# Patient Record
Sex: Male | Born: 1955 | Race: Black or African American | Hispanic: No | Smoking: Former smoker
Health system: Southern US, Community
[De-identification: ages and names within clinical notes are randomized; demographics above are authoritative.]

## PROBLEM LIST (undated history)

## (undated) DIAGNOSIS — N419 Inflammatory disease of prostate, unspecified: Secondary | ICD-10-CM

## (undated) DIAGNOSIS — I10 Essential (primary) hypertension: Secondary | ICD-10-CM

## (undated) HISTORY — PX: COLONOSCOPY: SHX174

## (undated) HISTORY — DX: Inflammatory disease of prostate, unspecified: N41.9

## (undated) HISTORY — PX: WISDOM TOOTH EXTRACTION: SHX21

## (undated) HISTORY — DX: Essential (primary) hypertension: I10

## (undated) HISTORY — PX: HERNIA REPAIR: SHX51

---

## 1998-01-14 ENCOUNTER — Encounter: Admission: RE | Admit: 1998-01-14 | Discharge: 1998-01-14 | Payer: Self-pay | Admitting: *Deleted

## 1998-02-12 ENCOUNTER — Encounter: Admission: RE | Admit: 1998-02-12 | Discharge: 1998-02-12 | Payer: Self-pay | Admitting: *Deleted

## 1999-05-09 HISTORY — PX: INGUINAL HERNIA REPAIR: SHX194

## 1999-12-19 ENCOUNTER — Ambulatory Visit (HOSPITAL_BASED_OUTPATIENT_CLINIC_OR_DEPARTMENT_OTHER): Admission: RE | Admit: 1999-12-19 | Discharge: 1999-12-19 | Payer: Self-pay

## 2004-11-02 ENCOUNTER — Ambulatory Visit: Payer: Self-pay | Admitting: Internal Medicine

## 2005-01-31 ENCOUNTER — Ambulatory Visit: Payer: Self-pay | Admitting: Internal Medicine

## 2005-07-25 ENCOUNTER — Ambulatory Visit: Payer: Self-pay | Admitting: Internal Medicine

## 2005-09-18 ENCOUNTER — Ambulatory Visit: Payer: Self-pay | Admitting: Internal Medicine

## 2005-09-27 ENCOUNTER — Ambulatory Visit: Payer: Self-pay | Admitting: Gastroenterology

## 2005-10-13 ENCOUNTER — Encounter: Payer: Self-pay | Admitting: Gastroenterology

## 2005-10-13 ENCOUNTER — Encounter (INDEPENDENT_AMBULATORY_CARE_PROVIDER_SITE_OTHER): Payer: Self-pay | Admitting: Specialist

## 2005-10-13 ENCOUNTER — Ambulatory Visit: Payer: Self-pay | Admitting: Gastroenterology

## 2006-01-15 ENCOUNTER — Ambulatory Visit: Payer: Self-pay | Admitting: Internal Medicine

## 2006-07-11 ENCOUNTER — Ambulatory Visit: Payer: Self-pay | Admitting: Internal Medicine

## 2007-01-21 ENCOUNTER — Ambulatory Visit: Payer: Self-pay | Admitting: Internal Medicine

## 2007-01-21 DIAGNOSIS — I10 Essential (primary) hypertension: Secondary | ICD-10-CM | POA: Insufficient documentation

## 2007-01-21 HISTORY — DX: Essential (primary) hypertension: I10

## 2007-02-12 ENCOUNTER — Emergency Department (HOSPITAL_COMMUNITY): Admission: EM | Admit: 2007-02-12 | Discharge: 2007-02-12 | Payer: Self-pay | Admitting: Emergency Medicine

## 2007-02-22 ENCOUNTER — Encounter: Payer: Self-pay | Admitting: Internal Medicine

## 2008-06-01 ENCOUNTER — Ambulatory Visit: Payer: Self-pay | Admitting: Internal Medicine

## 2008-06-03 ENCOUNTER — Telehealth: Payer: Self-pay | Admitting: Internal Medicine

## 2008-06-03 LAB — CONVERTED CEMR LAB
ALT: 25 units/L (ref 0–53)
AST: 22 units/L (ref 0–37)
Alkaline Phosphatase: 37 units/L — ABNORMAL LOW (ref 39–117)
Calcium: 9.4 mg/dL (ref 8.4–10.5)
Cholesterol: 168 mg/dL (ref 0–200)
Eosinophils Relative: 5.4 % — ABNORMAL HIGH (ref 0.0–5.0)
GFR calc Af Amer: 91 mL/min
GFR calc non Af Amer: 75 mL/min
HDL: 48.6 mg/dL (ref >39.0)
LDL Cholesterol: 109 mg/dL — ABNORMAL HIGH (ref 0–99)
Lymphocytes Relative: 30.4 % (ref 12.0–46.0)
Monocytes Absolute: 0.9 10*3/uL (ref 0.1–1.0)
Neutro Abs: 2.9 10*3/uL (ref 1.4–7.7)
Platelets: 213 10*3/uL (ref 150–400)
RDW: 12.7 % (ref 11.5–14.6)
Sodium: 143 meq/L (ref 135–145)
TSH: 0.94 microintl units/mL (ref 0.35–5.50)
Total CHOL/HDL Ratio: 3.5
Triglycerides: 51 mg/dL (ref 0–149)
VLDL: 10 mg/dL (ref 0–40)
WBC: 5.9 10*3/uL (ref 4.5–10.5)

## 2010-01-31 ENCOUNTER — Ambulatory Visit: Payer: Self-pay | Admitting: Internal Medicine

## 2010-06-04 ENCOUNTER — Encounter: Payer: Self-pay | Admitting: Internal Medicine

## 2010-06-07 NOTE — Assessment & Plan Note (Signed)
Summary: med check and refills/cjr----PT [facility] // RS   Vital Signs:  Patient profile:   55 year old male Weight:      201 pounds Temp:     98.1 degrees F oral BP sitting:   130 / 84  (left arm) Cuff size:   regular  Vitals Entered By: Duard Brady LPN (January 31, 2010 8:33 AM) CC: medication review - doing well Is Patient Diabetic? No   CC:  medication review - doing well.  History of Present Illness: 55 year-old was seen today  for follow-up of his hypertensionthird he was seen for his annual physical about 8 months ago.  Laboratory studies were unremarkable.  He did have a colonoscopy in 2007.  He feels well today.  His weight is down approximately 7 pounds since his last visit here.  No concerns or complaints.  Allergies (verified): No Known Drug Allergies  Past History:  Past Medical History: Prostatitis Hypertension  Review of Systems  The patient denies anorexia, fever, weight loss, weight gain, vision loss, decreased hearing, hoarseness, chest pain, syncope, dyspnea on exertion, peripheral edema, prolonged cough, headaches, hemoptysis, abdominal pain, melena, hematochezia, severe indigestion/heartburn, hematuria, incontinence, genital sores, muscle weakness, suspicious skin lesions, transient blindness, difficulty walking, depression, unusual weight change, abnormal bleeding, enlarged lymph nodes, angioedema, breast masses, and testicular masses.    Physical Exam  General:  Well-developed,well-nourished,in no acute distress; alert,appropriate and cooperative throughout examination; 126/80 Head:  Normocephalic and atraumatic without obvious abnormalities. No apparent alopecia or balding. Eyes:  No corneal or conjunctival inflammation noted. EOMI. Perrla. Funduscopic exam benign, without hemorrhages, exudates or papilledema. Vision grossly normal. Mouth:  Oral mucosa and oropharynx without lesions or exudates.  Teeth in good repair. Neck:  No deformities, masses,  or tenderness noted. Lungs:  Normal respiratory effort, chest expands symmetrically. Lungs are clear to auscultation, no crackles or wheezes. Heart:  Normal rate and regular rhythm. S1 and S2 normal without gallop, murmur, click, rub or other extra sounds. Abdomen:  Bowel sounds positive,abdomen soft and non-tender without masses, organomegaly or hernias noted. Msk:  No deformity or scoliosis noted of thoracic or lumbar spine.   Pulses:  R and L carotid,radial,femoral,dorsalis pedis and posterior tibial pulses are full and equal bilaterally Extremities:  No clubbing, cyanosis, edema, or deformity noted with normal full range of motion of all joints.     Impression & Recommendations:  Problem # 1:  HYPERTENSION (ICD-401.9)  The following medications were removed from the medication list:    Benicar Hct 40-25 Mg Tabs (Olmesartan medoxomil-hctz) .Marland Kitchen... Take 1 tablet by mouth once a day His updated medication list for this problem includes:    Benazepril-hydrochlorothiazide 20-12.5 Mg Tabs (Benazepril-hydrochlorothiazide) .Marland Kitchen..Marland Kitchen Two daily  The following medications were removed from the medication list:    Benicar Hct 40-25 Mg Tabs (Olmesartan medoxomil-hctz) .Marland Kitchen... Take 1 tablet by mouth once a day His updated medication list for this problem includes:    Benazepril-hydrochlorothiazide 20-12.5 Mg Tabs (Benazepril-hydrochlorothiazide) .Marland Kitchen..Marland Kitchen Two daily  Complete Medication List: 1)  Benazepril-hydrochlorothiazide 20-12.5 Mg Tabs (Benazepril-hydrochlorothiazide) .... Two daily  Patient Instructions: 1)  Please schedule a follow-up appointment in 6 months for annual exam 2)  Limit your Sodium (Salt). 3)  It is important that you exercise regularly at least 20 minutes 5 times a week. If you develop chest pain, have severe difficulty breathing, or feel very tired , stop exercising immediately and seek medical attention. 4)  You need to lose weight. Consider a lower calorie diet and  regular exercise.   Prescriptions: BENAZEPRIL-HYDROCHLOROTHIAZIDE 20-12.5 MG TABS (BENAZEPRIL-HYDROCHLOROTHIAZIDE) two daily  #180 Tablet x 3   Entered and Authorized by:   Gordy Savers  MD   Signed by:   Gordy Savers  MD on 01/31/2010   Method used:   Electronically to        [facility] [phone]* (retail)       [address]       [place], [place]  [address]       Ph: [phone] or [phone]       Fax: [phone]   RxID:   [id number]

## 2010-06-10 ENCOUNTER — Ambulatory Visit (INDEPENDENT_AMBULATORY_CARE_PROVIDER_SITE_OTHER): Payer: Medicare HMO | Admitting: Internal Medicine

## 2010-06-10 ENCOUNTER — Ambulatory Visit: Admit: 2010-06-10 | Payer: Self-pay | Admitting: Internal Medicine

## 2010-06-10 ENCOUNTER — Encounter: Payer: Self-pay | Admitting: Internal Medicine

## 2010-06-10 DIAGNOSIS — Z0000 Encounter for general adult medical examination without abnormal findings: Secondary | ICD-10-CM

## 2010-06-10 DIAGNOSIS — I10 Essential (primary) hypertension: Secondary | ICD-10-CM

## 2010-06-10 DIAGNOSIS — Z125 Encounter for screening for malignant neoplasm of prostate: Secondary | ICD-10-CM

## 2010-06-10 DIAGNOSIS — Z23 Encounter for immunization: Secondary | ICD-10-CM

## 2010-06-10 LAB — LIPID PANEL
Cholesterol: 163 mg/dL (ref 0–200)
LDL Cholesterol: 107 mg/dL — ABNORMAL HIGH (ref 0–99)
Triglycerides: 42 mg/dL (ref 0.0–149.0)

## 2010-06-10 LAB — HEPATIC FUNCTION PANEL
ALT: 24 U/L (ref 0–53)
AST: 23 U/L (ref 0–37)
Total Protein: 6.3 g/dL (ref 6.0–8.3)

## 2010-06-10 LAB — BASIC METABOLIC PANEL
CO2: 29 mEq/L (ref 19–32)
Calcium: 9 mg/dL (ref 8.4–10.5)
GFR: 89.03 mL/min (ref >60.00)
Glucose, Bld: 87 mg/dL (ref 70–99)
Potassium: 3.8 mEq/L (ref 3.5–5.1)

## 2010-06-10 LAB — CBC WITH DIFFERENTIAL/PLATELET
Basophils Relative: 0.6 % (ref 0.0–3.0)
Eosinophils Relative: 8 % — ABNORMAL HIGH (ref 0.0–5.0)
Hemoglobin: 14.3 g/dL (ref 13.0–17.0)
Lymphocytes Relative: 38.6 % (ref 12.0–46.0)
MCHC: 35 g/dL (ref 30.0–36.0)
Neutro Abs: 2 10*3/uL (ref 1.4–7.7)
Platelets: 217 10*3/uL (ref 150.0–400.0)
RDW: 13.5 % (ref 11.5–14.6)
WBC: 5.3 10*3/uL (ref 4.5–10.5)

## 2010-06-10 LAB — PSA: PSA: 2.04 ng/mL (ref 0.10–4.00)

## 2010-06-10 MED ORDER — BENAZEPRIL-HYDROCHLOROTHIAZIDE 20-12.5 MG PO TABS: 1.0000 | ORAL_TABLET | Freq: Every day | ORAL | Status: DC

## 2010-06-10 NOTE — Assessment & Plan Note (Signed)
Will continue his present blood pressure medication, exercise, salt reduction encouraged.  Modest weight loss encouraged.  Have asked the patient to monitor his blood pressure as an outpatient.

## 2010-06-10 NOTE — Progress Notes (Signed)
Addended by: Duard Brady on: 06/10/2010 10:50 AM   Modules accepted: Orders

## 2010-06-10 NOTE — Progress Notes (Signed)
  Subjective:    Patient ID: Jonathan Marks, male    DOB: July 22, 1955, 55 y.o.   MRN: [id number]  HPI  55 year old patient who is seen today for a health maintenance examination.  He does quite well.  Remains on antihypertensive medication with lysed blood pressure control.  No concerns or complaints today.  He is a former smoker, but discontinued years ago.  He did have screening colonoscopy at age 55 due to bright red rectal bleeding.    Review of Systems  Constitutional: Negative for fever, chills, activity change, appetite change and fatigue.  HENT: Negative for hearing loss, ear pain, congestion, rhinorrhea, sneezing, mouth sores, trouble swallowing, neck pain, neck stiffness, dental problem, voice change, sinus pressure and tinnitus.   Eyes: Negative for photophobia, pain, redness and visual disturbance.  Respiratory: Negative for apnea, cough, choking, chest tightness, shortness of breath and wheezing.   Cardiovascular: Negative for chest pain, palpitations and leg swelling.  Gastrointestinal: Negative for nausea, vomiting, abdominal pain, diarrhea, constipation, blood in stool, abdominal distention, anal bleeding and rectal pain.  Genitourinary: Negative for dysuria, urgency, frequency, hematuria, flank pain, decreased urine volume, discharge, penile swelling, scrotal swelling, difficulty urinating, genital sores and testicular pain.  Musculoskeletal: Negative for myalgias, back pain, joint swelling, arthralgias and gait problem.  Skin: Negative for color change, rash and wound.  Neurological: Negative for dizziness, tremors, seizures, syncope, facial asymmetry, speech difficulty, weakness, light-headedness, numbness and headaches.  Hematological: Negative for adenopathy. Does not bruise/bleed easily.  Psychiatric/Behavioral: Negative for suicidal ideas, hallucinations, behavioral problems, confusion, sleep disturbance, self-injury, dysphoric mood, decreased concentration and agitation. The  patient is not nervous/anxious.        Objective:   Physical Exam  Constitutional: He appears well-developed and well-nourished.  HENT:  Head: Normocephalic and atraumatic.  Right Ear: External ear normal.  Left Ear: External ear normal.  Nose: Nose normal.  Mouth/Throat: Oropharynx is clear and moist.  Eyes: Conjunctivae and EOM are normal. Pupils are equal, round, and reactive to light. No scleral icterus.  Neck: Normal range of motion. Neck supple. No JVD present. No thyromegaly present.  Cardiovascular: Regular rhythm, normal heart sounds and intact distal pulses.  Exam reveals no gallop and no friction rub.   No murmur heard. Pulmonary/Chest: Effort normal and breath sounds normal. He exhibits no tenderness.  Abdominal: Soft. Bowel sounds are normal. He exhibits no distension and no mass. There is no tenderness.  Genitourinary: Prostate normal and penis normal.  Musculoskeletal: Normal range of motion. He exhibits no edema and no tenderness.  Lymphadenopathy:    He has no cervical adenopathy.  Neurological: He is alert. He has normal reflexes. No cranial nerve deficit. Coordination normal.  Skin: Skin is warm and dry. No rash noted.  Psychiatric: He has a normal mood and affect. His behavior is normal.          Assessment & Plan:   1.  Unremarkable health maintenance examination

## 2010-06-10 NOTE — Patient Instructions (Signed)

## 2010-09-23 NOTE — Op Note (Signed)
[place]  Patient:    Jonathan Marks, Jonathan Marks                       MRN: [id number] Proc. Date: 12/19/99 Adm. Date:  [id number] Attending:  Henrene Dodge CC:         Anselm Pancoast. Zachery Dakins, M.D.                           Operative Report  PREOPERATIVE DIAGNOSIS:  Left inguinal hernia.  POSTOPERATIVE DIAGNOSIS:  Left direct inguinal hernia.  PROCEDURE:  Left inguinal herniorrhaphy with mesh reinforcement.  ANESTHESIA:  Local with sedation.  SURGEON:  Anselm Pancoast. Zachery Dakins, M.D.  ASSISTANT:  Nurse.  HISTORY:  Jonathan Marks is a 55 year old sort of thin, muscular male, who was referred to [place] for symptomatic left inguinal hernia.  The patient works out at [employer], etc., with a moderate amount of lifting.  He states that approximately four, maybe six weeks ago, started having some difficulty with pain and, afterward, noticed a definite bulge.  With straining he has an obvious hernia.  It relaxes spontaneously, sort of like the size of a large thumb, and I thought it goes to the external ring, and thought this was probably an indirect inguinal hernia.  I could not feel a definite hernia on the right.  There is a little bit of a weakness, and I recommended that we repair this with local with sedation, and thought most likely it would be an indirect inguinal hernia.  The patient has talked with his employers, and probably will need to be off work for four weeks since he is not able to go back on a part-time or light activity.  Preoperatively he was given a g of Kefzol and taken to the operative suite.  PROCEDURE IN DETAIL:  The abdomen was first shaved and then prepped with Betadine solution and draped in a sterile manner.  The inguinal incision area was infiltrated with a mixture of Marcaine and 0.5% Xylocaine with adrenalin. Then the ilioinguinal nerve area was infiltrated with a blunted 22-gauge needle,  all total about 35-40 cc of solution was used.  Sharp dissection of the skin, subcutaneous, Scarpas fascia were identified in the external oblique.  I opened the external oblique through the external ring, identified the ilioinguinal nerve, and the hernia that you can see easily protruding appears to be direct, not an indirect hernia.  The cord structures were separated from the direct hernia.  The nerve was kept with the cord structures.  Then I freed the direct weakness circumferentially, then basically opened it, and did a Shouldice type repair with superior and inferior flaps, the inferior being sutured to the lateral edge of the rectus and then superior to the inguinal ligament. This was done with a running 2-0 Prolene.  Then I placed a piece of Prolene mesh shaped like a sail slipped laterally on the floor and sutured with a running 2-0 Prolene, suited it to the inguinal ligament, with two tails being sutured together laterally recreating an internal ring, and then the superior flap being sutured with interrupted sutures of 2-0 Prolene to the conjoined tendon and rectus internal oblique area.  The ilioinguinal nerve was protected and brought out with the cord structures under the mesh.  Then the external oblique was closed with a running 3-0 Vicryl, and the Scarpas fascia was closed with interrupted  3-0 Vicryl fibredex run subcuticular, and then Steri-Strips on the skin.  The patient tolerated the procedure nicely and will be released after a short stay in the recovery room.  I will see him back in the office in approximately ten days. DD:  12/19/99 TD:  12/19/99 Job: [id number]

## 2010-12-27 ENCOUNTER — Other Ambulatory Visit: Payer: Self-pay

## 2010-12-27 DIAGNOSIS — I10 Essential (primary) hypertension: Secondary | ICD-10-CM

## 2010-12-27 MED ORDER — BENAZEPRIL-HYDROCHLOROTHIAZIDE 20-12.5 MG PO TABS: 1.0000 | ORAL_TABLET | Freq: Every day | ORAL | Status: DC

## 2011-02-06 HISTORY — PX: OTHER SURGICAL HISTORY: SHX169

## 2011-02-16 LAB — URINALYSIS, ROUTINE W REFLEX MICROSCOPIC
Glucose, UA: NEGATIVE
Leukocytes, UA: NEGATIVE
Urobilinogen, UA: 1
pH: 5.5

## 2011-02-16 LAB — URINE MICROSCOPIC-ADD ON

## 2011-02-16 LAB — POCT URINE HEMOGLOBIN: Operator id: 24446

## 2011-02-22 ENCOUNTER — Encounter (HOSPITAL_BASED_OUTPATIENT_CLINIC_OR_DEPARTMENT_OTHER)
Admission: RE | Admit: 2011-02-22 | Discharge: 2011-02-22 | Disposition: A | Discharge status: Home / Self Care | Payer: Managed Care, Other (non HMO) | Source: Ambulatory Visit | Attending: Podiatry | Admitting: Podiatry

## 2011-02-22 LAB — BASIC METABOLIC PANEL
BUN: 15 mg/dL (ref 6–23)
CO2: 28 mEq/L (ref 19–32)
Calcium: 9.6 mg/dL (ref 8.4–10.5)
Chloride: 103 mEq/L (ref 96–112)
Creatinine, Ser: 1.2 mg/dL (ref 0.50–1.35)
GFR calc Af Amer: 78 mL/min — ABNORMAL LOW (ref >90)
Glucose, Bld: 96 mg/dL (ref 70–99)
Potassium: 3.8 mEq/L (ref 3.5–5.1)

## 2011-02-22 LAB — DIFFERENTIAL
Eosinophils Absolute: 0.2 10*3/uL (ref 0.0–0.7)
Eosinophils Relative: 3 % (ref 0–5)
Monocytes Absolute: 0.8 10*3/uL (ref 0.1–1.0)
Monocytes Relative: 13 % — ABNORMAL HIGH (ref 3–12)
Neutro Abs: 2.8 10*3/uL (ref 1.7–7.7)
Neutrophils Relative %: 47 % (ref 43–77)

## 2011-02-22 LAB — CBC
Hemoglobin: 13.7 g/dL (ref 13.0–17.0)
MCH: 30.7 pg (ref 26.0–34.0)

## 2011-02-24 ENCOUNTER — Ambulatory Visit (HOSPITAL_BASED_OUTPATIENT_CLINIC_OR_DEPARTMENT_OTHER)
Admission: RE | Admit: 2011-02-24 | Discharge: 2011-02-24 | Disposition: A | Discharge status: Home / Self Care | Payer: Managed Care, Other (non HMO) | Source: Ambulatory Visit | Attending: Podiatry | Admitting: Podiatry

## 2011-02-24 DIAGNOSIS — Z01810 Encounter for preprocedural cardiovascular examination: Secondary | ICD-10-CM | POA: Insufficient documentation

## 2011-02-24 DIAGNOSIS — M722 Plantar fascial fibromatosis: Secondary | ICD-10-CM | POA: Insufficient documentation

## 2011-02-24 DIAGNOSIS — Z01812 Encounter for preprocedural laboratory examination: Secondary | ICD-10-CM | POA: Insufficient documentation

## 2011-02-24 DIAGNOSIS — I10 Essential (primary) hypertension: Secondary | ICD-10-CM | POA: Insufficient documentation

## 2011-02-24 DIAGNOSIS — Q6600 Congenital talipes equinovarus, unspecified foot: Secondary | ICD-10-CM | POA: Insufficient documentation

## 2011-03-02 NOTE — Op Note (Signed)
NAMEGORMAN, SAFI                ACCOUNT NO.:  [id number]  MEDICAL RECORD NO.:  [id number]  LOCATION:                                 FACILITY:  PHYSICIAN:  Merwyn Katos, DPM       DATE OF BIRTH:  06/22/1955  DATE OF PROCEDURE: DATE OF DISCHARGE:                              OPERATIVE REPORT   SURGEON:  Merwyn Katos, DPM  PREOPERATIVE DIAGNOSES: 1. 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 2. Plantar fasciitis, left.  POSTOPERATIVE DIAGNOSES: 1. 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 2. Plantar fasciitis, left.  PROCEDURES: 1. Endoscopic gastrocnemius resection, left. 2. Plantar fasciotomy, left with TOPAZ MicroDebrider.  ANESTHESIA:  General.  HEMOSTASIS:  Calf tourniquet at 250 mmHg, left.  ESTIMATED BLOOD LOSS:  Minimal.  PREOPERATIVE MEDICATIONS:  Ancef 2 g IV preop.  INDICATIONS:  The patient presented with a complaint of pain at the heel.  He has had continued pain despite injections and conservative treatment with immobilization, braces, and splints.  On physical examination, the patient had tenderness at the plantar fascia and the plantar calcaneus and tubercle.  There was restriction of motion at the ankle joint with gastroc equinus present.  Discussed with the patient surgery consistent with gastroc resection through an endoscope, as well as plantar fasciotomy with TOPAZ MicroDebrider , left.  Discussed the procedure, risks, and benefits of the discussed procedure.  Consent was signed and all questions answered and the patient was cleared for surgery, history and physical.  PROCEDURE IN DETAIL:  The patient was placed in a supine position on the operating table.  General anesthesia administered.  Block at surgical site performed including a PT block with 10 mL of 1:1 mixture of 0.5% Marcaine plain and 2% lidocaine plain.  Block was performed at the gastroc resection site with 10 mL of  2% lidocaine with epinephrine. Foot was prepped and draped in a standard manner.  Foot exsanguinated with an Esmarch bandage, and calf tourniquet inflated to 250 mmHg.  PROCEDURE 1:  Incision made along the medial aspect of the border of the gastroc aponeurosis.  Incision was carried down to the subcutaneous tissues down to the gastroc aponeurosis.  Probe placed across the aponeurosis to the lateral aspect.  Obturator and cannula placed through the incision to the lateral edge of the gastroc aponeurosis where an exit incision was made.  Cannula placed and obturator removed. Endoscope introduced with good visualization of the gastroc aponeurosis across the entire width.  The medial three-quarters of the gastroc aponeurosis was transected and lengthened with equal range of motion after doing this with the knee flexed or extended.  Surgery site was irrigated with normal saline.  Skin closed with 4-0 nylon simple interrupted sutures.  Surgery site infiltrated with 9 mL of 0.5% Marcaine plain and 1 mL of Decadron.  PROCEDURE 2:  28 stab incisions was made with 0.062 K-wire in the area area of tenderness which was marked out preoperatively.  Utilizing a TOPAZ MicroDebrider on a setting of 4 with saline running at approximately 2-3 drops per second, multiple variable depth debridements were  performed on the plantar fascia utilizing TOPAZ MicroDebrider.  Surgery site was irrigated with normal saline.  Xeroform gauze was applied to the incisions, and gauze, Kerlix, and Coban dressing was applied.  Also note that intraoperatively an additional 5 mL of 0.5% Marcaine plain was infiltrated in the surgery site at the plantar fascia.  During the procedure, there was no blood loss.  The patient was taken to recovery area in stable condition.     Merwyn Katos, DPM     JP/MEDQ  D:  02/24/2011  T:  02/24/2011  Job:  [id number]  Electronically Signed by Merwyn Katos DPM on 03/02/2011 01:36:46 PM

## 2011-04-24 ENCOUNTER — Other Ambulatory Visit: Payer: Self-pay | Admitting: Podiatry

## 2011-11-10 ENCOUNTER — Encounter: Payer: Self-pay | Admitting: Internal Medicine

## 2011-11-10 ENCOUNTER — Ambulatory Visit (INDEPENDENT_AMBULATORY_CARE_PROVIDER_SITE_OTHER): Payer: Managed Care, Other (non HMO) | Admitting: Internal Medicine

## 2011-11-10 VITALS — BP 122/80 | HR 80 | Temp 98.2°F | Resp 18 | Ht 70.0 in | Wt 206.0 lb

## 2011-11-10 DIAGNOSIS — Z0000 Encounter for general adult medical examination without abnormal findings: Secondary | ICD-10-CM

## 2011-11-10 DIAGNOSIS — I10 Essential (primary) hypertension: Secondary | ICD-10-CM

## 2011-11-10 LAB — COMPREHENSIVE METABOLIC PANEL
AST: 17 U/L (ref 0–37)
Alkaline Phosphatase: 46 U/L (ref 39–117)
BUN: 18 mg/dL (ref 6–23)
Chloride: 106 mEq/L (ref 96–112)
Creatinine, Ser: 1.1 mg/dL (ref 0.4–1.5)
Glucose, Bld: 97 mg/dL (ref 70–99)
Potassium: 3.7 mEq/L (ref 3.5–5.1)
Sodium: 140 mEq/L (ref 135–145)

## 2011-11-10 LAB — PSA: PSA: 2.98 ng/mL (ref 0.10–4.00)

## 2011-11-10 LAB — LIPID PANEL
Cholesterol: 162 mg/dL (ref 0–200)
HDL: 54.5 mg/dL (ref >39.00)
LDL Cholesterol: 95 mg/dL (ref 0–99)
Triglycerides: 64 mg/dL (ref 0.0–149.0)
VLDL: 12.8 mg/dL (ref 0.0–40.0)

## 2011-11-10 LAB — CBC WITH DIFFERENTIAL/PLATELET
Basophils Absolute: 0 10*3/uL (ref 0.0–0.1)
Eosinophils Absolute: 0.2 10*3/uL (ref 0.0–0.7)
Eosinophils Relative: 4 % (ref 0.0–5.0)
Hemoglobin: 14.5 g/dL (ref 13.0–17.0)
Lymphocytes Relative: 31.3 % (ref 12.0–46.0)
MCHC: 33.4 g/dL (ref 30.0–36.0)
Monocytes Relative: 14.3 % — ABNORMAL HIGH (ref 3.0–12.0)
Neutro Abs: 2.8 10*3/uL (ref 1.4–7.7)
Neutrophils Relative %: 49.8 % (ref 43.0–77.0)

## 2011-11-10 LAB — TSH: TSH: 1.29 u[IU]/mL (ref 0.35–5.50)

## 2011-11-10 MED ORDER — BENAZEPRIL-HYDROCHLOROTHIAZIDE 20-12.5 MG PO TABS: 1.0000 | ORAL_TABLET | Freq: Every day | ORAL | Status: DC

## 2011-11-10 NOTE — Patient Instructions (Signed)
Limit your sodium (Salt) intake  Please check your blood pressure on a regular basis.  If it is consistently greater than 150/90, please make an office appointment.  Return in one year for follow-up  

## 2011-11-10 NOTE — Progress Notes (Signed)
Subjective:    Patient ID: Jonathan Marks, male    DOB: Dec 05, 1955, 56 y.o.   MRN: [id number]  HPI  56 year old patient who is seen today for a wellness exam. He has treated hypertension and enjoys excellent health. No concerns or complaints. He did have a screening colonoscopy between the ages of [age]. No concerns or complaints today  Past Medical History  Diagnosis Date  . Unspecified essential hypertension 01/21/2007  . Prostatitis     History   Social History  . Marital Status: Married    Spouse Name: N/A    Number of Children: N/A  . Years of Education: N/A   Occupational History  . Not on file.   Social History Main Topics  . Smoking status: Former Games developer  . Smokeless tobacco: Not on file  . Alcohol Use: No  . Drug Use:   . Sexually Active:      1 daughter, 2 grandchildren   Other Topics Concern  . Not on file   Social History Narrative  . No narrative on file    Past Surgical History  Procedure Date  . Inguinal hernia repair 2001    left , Dr.Weatherly  . Colonoscopy     age 23  .  left plantar fasciotomy October 2012    Family History  Problem Relation Age of Onset  . Hypertension Mother   . Alcohol abuse Father   . Stroke Father   . Alcohol abuse Other   . Hypertension Other   . Stroke Father   . Pancreatic cancer Sister     No Known Allergies  No current outpatient prescriptions on file prior to visit.    BP 122/80  Pulse 80  Temp 98.2 F (36.8 C) (Oral)  Resp 18  Ht 5\' 10"  (1.778 m)  Wt 206 lb (93.441 kg)  BMI 29.56 kg/m2  SpO2 97%      Review of Systems  Constitutional: Negative for fever, chills, activity change, appetite change and fatigue.  HENT: Negative for hearing loss, ear pain, congestion, rhinorrhea, sneezing, mouth sores, trouble swallowing, neck pain, neck stiffness, dental problem, voice change, sinus pressure and tinnitus.   Eyes: Negative for photophobia, pain, redness and visual disturbance.  Respiratory:  Negative for apnea, cough, choking, chest tightness, shortness of breath and wheezing.   Cardiovascular: Negative for chest pain, palpitations and leg swelling.  Gastrointestinal: Negative for nausea, vomiting, abdominal pain, diarrhea, constipation, blood in stool, abdominal distention, anal bleeding and rectal pain.  Genitourinary: Negative for dysuria, urgency, frequency, hematuria, flank pain, decreased urine volume, discharge, penile swelling, scrotal swelling, difficulty urinating, genital sores and testicular pain.  Musculoskeletal: Negative for myalgias, back pain, joint swelling, arthralgias and gait problem.  Skin: Negative for color change, rash and wound.  Neurological: Negative for dizziness, tremors, seizures, syncope, facial asymmetry, speech difficulty, weakness, light-headedness, numbness and headaches.  Hematological: Negative for adenopathy. Does not bruise/bleed easily.  Psychiatric/Behavioral: Negative for suicidal ideas, hallucinations, behavioral problems, confusion, disturbed wake/sleep cycle, self-injury, dysphoric mood, decreased concentration and agitation. The patient is not nervous/anxious.        Objective:   Physical Exam  Constitutional: He appears well-developed and well-nourished.  HENT:  Head: Normocephalic and atraumatic.  Right Ear: External ear normal.  Left Ear: External ear normal.  Nose: Nose normal.  Mouth/Throat: Oropharynx is clear and moist.  Eyes: Conjunctivae and EOM are normal. Pupils are equal, round, and reactive to light. No scleral icterus.  Neck: Normal range of motion. Neck supple.  No JVD present. No thyromegaly present.  Cardiovascular: Regular rhythm, normal heart sounds and intact distal pulses.  Exam reveals no gallop and no friction rub.   No murmur heard. Pulmonary/Chest: Effort normal and breath sounds normal. He exhibits no tenderness.  Abdominal: Soft. Bowel sounds are normal. He exhibits no distension and no mass. There is no  tenderness.  Genitourinary: Prostate normal and penis normal.  Musculoskeletal: Normal range of motion. He exhibits no edema and no tenderness.       A bony nodule just inferior to the left patella  Lymphadenopathy:    He has no cervical adenopathy.  Neurological: He is alert. He has normal reflexes. No cranial nerve deficit. Coordination normal.  Skin: Skin is warm and dry. No rash noted.  Psychiatric: He has a normal mood and affect. His behavior is normal.          Assessment & Plan:   Preventive health examination Hypertension  Low-salt diet recommended Regular exercise encouraged Home blood pressure monitoring encouraged  Recheck one year

## 2011-11-10 NOTE — Progress Notes (Signed)
Quick Note:  Attempt to call - VM- LMTCB if questions - labs normal ______ 

## 2012-01-21 ENCOUNTER — Other Ambulatory Visit: Payer: Self-pay | Admitting: Internal Medicine

## 2012-02-20 ENCOUNTER — Emergency Department (HOSPITAL_COMMUNITY)
Admission: EM | Admit: 2012-02-20 | Discharge: 2012-02-20 | Disposition: A | Discharge status: Home / Self Care | Payer: Managed Care, Other (non HMO) | Attending: Emergency Medicine | Admitting: Emergency Medicine

## 2012-02-20 ENCOUNTER — Encounter (HOSPITAL_COMMUNITY): Payer: Self-pay | Admitting: Emergency Medicine

## 2012-02-20 DIAGNOSIS — Z87891 Personal history of nicotine dependence: Secondary | ICD-10-CM | POA: Insufficient documentation

## 2012-02-20 DIAGNOSIS — S90569A Insect bite (nonvenomous), unspecified ankle, initial encounter: Secondary | ICD-10-CM | POA: Insufficient documentation

## 2012-02-20 DIAGNOSIS — Y998 Other external cause status: Secondary | ICD-10-CM | POA: Insufficient documentation

## 2012-02-20 DIAGNOSIS — W57XXXA Bitten or stung by nonvenomous insect and other nonvenomous arthropods, initial encounter: Secondary | ICD-10-CM | POA: Insufficient documentation

## 2012-02-20 DIAGNOSIS — Y93I9 Activity, other involving external motion: Secondary | ICD-10-CM | POA: Insufficient documentation

## 2012-02-20 DIAGNOSIS — T07XXXA Unspecified multiple injuries, initial encounter: Secondary | ICD-10-CM

## 2012-02-20 MED ORDER — ACETAMINOPHEN 325 MG PO TABS
650.0000 mg | ORAL_TABLET | Freq: Once | ORAL | Status: AC
  Administered 2012-02-20: 650 mg via ORAL
  Filled 2012-02-20 (×2): qty 2
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
## 2012-02-20 NOTE — ED Notes (Signed)
EDP took pt off LSB and out of C collar.

## 2012-02-27 ENCOUNTER — Ambulatory Visit (INDEPENDENT_AMBULATORY_CARE_PROVIDER_SITE_OTHER): Payer: Managed Care, Other (non HMO) | Admitting: Internal Medicine

## 2012-02-27 ENCOUNTER — Encounter: Payer: Self-pay | Admitting: Internal Medicine

## 2012-02-27 VITALS — BP 128/90 | Temp 97.9°F | Wt 207.0 lb

## 2012-02-27 DIAGNOSIS — R51 Headache: Secondary | ICD-10-CM

## 2012-02-27 DIAGNOSIS — I10 Essential (primary) hypertension: Secondary | ICD-10-CM

## 2012-02-27 DIAGNOSIS — M542 Cervicalgia: Secondary | ICD-10-CM
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
## 2012-02-27 NOTE — Patient Instructions (Signed)
Relafen one daily  You  may move around, but avoid painful motions and activities.  Apply  Heat  to the sore area for 15 to 20 minutes 3 or 4 times daily for the next two to 3 days.  Call or return to clinic prn if these symptoms worsen or fail to improve as anticipated.

## 2012-06-05 ENCOUNTER — Other Ambulatory Visit: Payer: Self-pay | Admitting: *Deleted

## 2012-06-05 DIAGNOSIS — I10 Essential (primary) hypertension: Secondary | ICD-10-CM

## 2012-06-05 MED ORDER — BENAZEPRIL-HYDROCHLOROTHIAZIDE 20-12.5 MG PO TABS: 1.0000 | ORAL_TABLET | Freq: Every day | ORAL | Status: DC

## 2012-11-17 ENCOUNTER — Ambulatory Visit: Payer: Managed Care, Other (non HMO) | Admitting: Emergency Medicine

## 2012-11-17 VITALS — BP 153/85 | HR 62 | Temp 97.8°F | Resp 18 | Wt 202.0 lb

## 2012-11-17 DIAGNOSIS — L02219 Cutaneous abscess of trunk, unspecified: Secondary | ICD-10-CM

## 2012-11-17 DIAGNOSIS — L03319 Cellulitis of trunk, unspecified: Secondary | ICD-10-CM

## 2012-11-17 MED ORDER — SULFAMETHOXAZOLE-TRIMETHOPRIM 800-160 MG PO TABS: 1.0000 | ORAL_TABLET | Freq: Two times a day (BID) | ORAL | Status: DC

## 2012-11-17 NOTE — Patient Instructions (Addendum)
Abscess An abscess is an infected area that contains a collection of pus and debris.It can occur in almost any part of the body. An abscess is also known as a furuncle or boil. CAUSES  An abscess occurs when tissue gets infected. This can occur from blockage of oil or sweat glands, infection of hair follicles, or a minor injury to the skin. As the body tries to fight the infection, pus collects in the area and creates pressure under the skin. This pressure causes pain. People with weakened immune systems have difficulty fighting infections and get certain abscesses more often.  SYMPTOMS Usually an abscess develops on the skin and becomes a painful mass that is red, warm, and tender. If the abscess forms under the skin, you may feel a moveable soft area under the skin. Some abscesses break open (rupture) on their own, but most will continue to get worse without care. The infection can spread deeper into the body and eventually into the bloodstream, causing you to feel ill.  DIAGNOSIS  Your caregiver will take your medical history and perform a physical exam. A sample of fluid may also be taken from the abscess to determine what is causing your infection. TREATMENT  Your caregiver may prescribe antibiotic medicines to fight the infection. However, taking antibiotics alone usually does not cure an abscess. Your caregiver may need to make a small cut (incision) in the abscess to drain the pus. In some cases, gauze is packed into the abscess to reduce pain and to continue draining the area. HOME CARE INSTRUCTIONS   Only take over-the-counter or prescription medicines for pain, discomfort, or fever as directed by your caregiver.  If you were prescribed antibiotics, take them as directed. Finish them even if you start to feel better.  If gauze is used, follow your caregiver's directions for changing the gauze.  To avoid spreading the infection:  Keep your draining abscess covered with a  bandage.  Wash your hands well.  Do not share personal care items, towels, or whirlpools with others.  Avoid skin contact with others.  Keep your skin and clothes clean around the abscess.  Keep all follow-up appointments as directed by your caregiver. SEEK MEDICAL CARE IF:   You have increased pain, swelling, redness, fluid drainage, or bleeding.  You have muscle aches, chills, or a general ill feeling.  You have a fever. MAKE SURE YOU:   Understand these instructions.  Will watch your condition.  Will get help right away if you are not doing well or get worse. Document Released: 02/01/2005 Document Revised: 10/24/2011 Document Reviewed: 07/07/2011 [employer] Patient Information 2014 [employer].  

## 2012-11-17 NOTE — Progress Notes (Signed)
[facility] [address] [place] [address] [phone]  Date:  11/17/2012   Name:  Jonathan Marks   DOB:  1955-06-28   MRN:  [id number]  PCP:  Rogelia Boga, MD    Chief Complaint: bump on back   History of Present Illness:  Jonathan Marks is a 57 y.o. very pleasant male patient who presents with the following:  Has a tender mass on upper back for the past week.  Now very tender.  No history of injury or overuse.  No fever or chills.  No nausea or vomiting. No improvement with over the counter medications or other home remedies. Denies other complaint or health concern today.   Patient Active Problem List   Diagnosis Date Noted  . Hypertension 06/10/2010  . Health maintenance examination 06/10/2010  . Unspecified Essential Hypertension 01/21/2007    Past Medical History  Diagnosis Date  . Unspecified essential hypertension 01/21/2007  . Prostatitis     Past Surgical History  Procedure Laterality Date  . Inguinal hernia repair  2001    left , Dr.Weatherly  . Colonoscopy      age 35  .  left plantar fasciotomy  October 2012  . Hernia repair      History  Substance Use Topics  . Smoking status: Former Games developer  . Smokeless tobacco: Not on file  . Alcohol Use: No    Family History  Problem Relation Age of Onset  . Hypertension Mother   . Alcohol abuse Father   . Stroke Father   . Alcohol abuse Other   . Hypertension Other   . Stroke Father   . Pancreatic cancer Sister     No Known Allergies  Medication list has been reviewed and updated.  Current Outpatient Prescriptions on File Prior to Visit  Medication Sig Dispense Refill  . benazepril-hydrochlorthiazide (LOTENSIN HCT) 20-12.5 MG per tablet Take 1 tablet by mouth daily.  90 tablet  1  . cetirizine (ZYRTEC) 10 MG tablet Take 10 mg by mouth daily as needed. For allergies       No current facility-administered medications on file prior to visit.    Review of  Systems:  As per HPI, otherwise negative.    Physical Examination: Filed Vitals:   11/17/12 1306  BP: 153/85  Pulse: 62  Temp: 97.8 F (36.6 C)  Resp: 18   Filed Vitals:   11/17/12 1306  Weight: 202 lb (91.627 kg)   Body mass index is 28.98 kg/(m^2). Ideal Body Weight:     GEN: WDWN, NAD, Non-toxic, Alert & Oriented x 3 HEENT: Atraumatic, Normocephalic.  Ears and Nose: No external deformity. EXTR: No clubbing/cyanosis/edema NEURO: Normal gait.  PSYCH: Normally interactive. Conversant. Not depressed or anxious appearing.  Calm demeanor.  BACK  Sebaceous cyst that is infected in upper back.    Assessment and Plan: Infected sebaceous cyst Septra Local heat Discussed options including I&D, excision and antibiotics.  Elected last choice.  Will follow up when antibiotics are done or if it worsens.   Signed,  Phillips Odor, MD

## 2012-12-14 ENCOUNTER — Other Ambulatory Visit: Payer: Self-pay | Admitting: Internal Medicine

## 2013-03-07 ENCOUNTER — Encounter: Payer: Self-pay | Admitting: Internal Medicine

## 2013-03-07 ENCOUNTER — Ambulatory Visit (INDEPENDENT_AMBULATORY_CARE_PROVIDER_SITE_OTHER): Payer: Managed Care, Other (non HMO) | Admitting: Internal Medicine

## 2013-03-07 VITALS — BP 120/90 | HR 74 | Temp 98.3°F | Ht 70.75 in | Wt 203.0 lb

## 2013-03-07 DIAGNOSIS — I10 Essential (primary) hypertension: Secondary | ICD-10-CM

## 2013-03-07 DIAGNOSIS — Z0000 Encounter for general adult medical examination without abnormal findings: Secondary | ICD-10-CM

## 2013-03-07 LAB — COMPREHENSIVE METABOLIC PANEL
ALT: 22 U/L (ref 0–53)
BUN: 13 mg/dL (ref 6–23)
CO2: 29 mEq/L (ref 19–32)
Calcium: 9.2 mg/dL (ref 8.4–10.5)
Creatinine, Ser: 0.9 mg/dL (ref 0.4–1.5)
Sodium: 138 mEq/L (ref 135–145)
Total Bilirubin: 0.8 mg/dL (ref 0.3–1.2)
Total Protein: 6.3 g/dL (ref 6.0–8.3)

## 2013-03-07 LAB — CBC WITH DIFFERENTIAL/PLATELET
Basophils Absolute: 0 10*3/uL (ref 0.0–0.1)
Eosinophils Absolute: 0.4 10*3/uL (ref 0.0–0.7)
Eosinophils Relative: 8 % — ABNORMAL HIGH (ref 0.0–5.0)
Lymphs Abs: 1.7 10*3/uL (ref 0.7–4.0)
MCV: 89.4 fl (ref 78.0–100.0)
Monocytes Absolute: 0.6 10*3/uL (ref 0.1–1.0)
Monocytes Relative: 13.4 % — ABNORMAL HIGH (ref 3.0–12.0)
Neutro Abs: 2 10*3/uL (ref 1.4–7.7)
Platelets: 203 10*3/uL (ref 150.0–400.0)
RDW: 13.4 % (ref 11.5–14.6)

## 2013-03-07 LAB — TSH: TSH: 0.91 u[IU]/mL (ref 0.35–5.50)

## 2013-03-07 LAB — LIPID PANEL
Cholesterol: 159 mg/dL (ref 0–200)
Total CHOL/HDL Ratio: 3
Triglycerides: 31 mg/dL (ref 0.0–149.0)

## 2013-03-07 LAB — PSA: PSA: 2.74 ng/mL (ref 0.10–4.00)

## 2013-03-07 MED ORDER — BENAZEPRIL-HYDROCHLOROTHIAZIDE 20-12.5 MG PO TABS: 1.0000 | ORAL_TABLET | Freq: Every day | ORAL | Status: DC

## 2013-03-07 NOTE — Patient Instructions (Signed)
Limit your sodium (Salt) intake    It is important that you exercise regularly, at least 20 minutes 3 to 4 times per week.  If you develop chest pain or shortness of breath seek  medical attention.  Please check your blood pressure on a regular basis.  If it is consistently greater than 150/90, please make an office appointment.  

## 2013-03-07 NOTE — Progress Notes (Signed)
Patient ID: Jonathan Marks, male   DOB: [birth year], 56 y.o.   MRN: [id number]  Subjective:    Patient ID: Jonathan Marks, male    DOB: [birth year], 57 y.o.   MRN: [id number]  HPI  57 year old patient who is seen today for a wellness exam. He has treated hypertension and enjoys excellent health. No concerns or complaints. He did have a screening colonoscopy between the ages of [age]. No concerns or complaints today. He remained active at  work with the physical activity throughout the day   Past Medical History  Diagnosis Date  . Unspecified essential hypertension 01/21/2007  . Prostatitis     History   Social History  . Marital Status: Married    Spouse Name: N/A    Number of Children: N/A  . Years of Education: N/A   Occupational History  . Not on file.   Social History Main Topics  . Smoking status: Former Games developer  . Smokeless tobacco: Not on file  . Alcohol Use: No  . Drug Use:   . Sexual Activity:      Comment: 1 daughter, 2 grandchildren   Other Topics Concern  . Not on file   Social History Narrative  . No narrative on file    Past Surgical History  Procedure Laterality Date  . Inguinal hernia repair  2001    left , Dr.Weatherly  . Colonoscopy      age 51  .  left plantar fasciotomy  October 2012  . Hernia repair      Family History  Problem Relation Age of Onset  . Hypertension Mother   . Alcohol abuse Father   . Stroke Father   . Alcohol abuse Other   . Hypertension Other   . Stroke Father   . Pancreatic cancer Sister     No Known Allergies  Current Outpatient Prescriptions on File Prior to Visit  Medication Sig Dispense Refill  . benazepril-hydrochlorthiazide (LOTENSIN HCT) 20-12.5 MG per tablet Take 1 tablet by mouth daily.  90 tablet  1   No current facility-administered medications on file prior to visit.    BP 120/90  Pulse 74  Temp(Src) 98.3 F (36.8 C) (Oral)  Ht 5' 10.75" (1.797 m)  Wt 203 lb (92.08 kg)  BMI 28.51 kg/m2  SpO2  97%      Review of Systems  Constitutional: Negative for fever, chills, activity change, appetite change and fatigue.  HENT: Negative for congestion, dental problem, ear pain, hearing loss, mouth sores, rhinorrhea, sinus pressure, sneezing, tinnitus, trouble swallowing and voice change.   Eyes: Negative for photophobia, pain, redness and visual disturbance.  Respiratory: Negative for apnea, cough, choking, chest tightness, shortness of breath and wheezing.   Cardiovascular: Negative for chest pain, palpitations and leg swelling.  Gastrointestinal: Negative for nausea, vomiting, abdominal pain, diarrhea, constipation, blood in stool, abdominal distention, anal bleeding and rectal pain.  Genitourinary: Negative for dysuria, urgency, frequency, hematuria, flank pain, decreased urine volume, discharge, penile swelling, scrotal swelling, difficulty urinating, genital sores and testicular pain.  Musculoskeletal: Negative for arthralgias, back pain, gait problem, joint swelling, myalgias, neck pain and neck stiffness.  Skin: Negative for color change, rash and wound.  Neurological: Negative for dizziness, tremors, seizures, syncope, facial asymmetry, speech difficulty, weakness, light-headedness, numbness and headaches.  Hematological: Negative for adenopathy. Does not bruise/bleed easily.  Psychiatric/Behavioral: Negative for suicidal ideas, hallucinations, behavioral problems, confusion, sleep disturbance, self-injury, dysphoric mood, decreased concentration and agitation. The patient is not nervous/anxious.  Objective:   Physical Exam  Constitutional: He appears well-developed and well-nourished.  HENT:  Head: Normocephalic and atraumatic.  Right Ear: External ear normal.  Left Ear: External ear normal.  Nose: Nose normal.  Mouth/Throat: Oropharynx is clear and moist.  Eyes: Conjunctivae and EOM are normal. Pupils are equal, round, and reactive to light. No scleral icterus.  Neck:  Normal range of motion. Neck supple. No JVD present. No thyromegaly present.  Cardiovascular: Regular rhythm, normal heart sounds and intact distal pulses.  Exam reveals no gallop and no friction rub.   No murmur heard. Pulmonary/Chest: Effort normal and breath sounds normal. He exhibits no tenderness.  Abdominal: Soft. Bowel sounds are normal. He exhibits no distension and no mass. There is no tenderness.  Genitourinary: Prostate normal and penis normal.  Musculoskeletal: Normal range of motion. He exhibits no edema and no tenderness.  A bony nodule just inferior to the left patella  Lymphadenopathy:    He has no cervical adenopathy.  Neurological: He is alert. He has normal reflexes. No cranial nerve deficit. Coordination normal.  Skin: Skin is warm and dry. No rash noted.  Psychiatric: He has a normal mood and affect. His behavior is normal.          Assessment & Plan:   Preventive health examination Hypertension  Low-salt diet recommended Regular exercise encouraged Home blood pressure monitoring encouraged  Recheck one year

## 2014-03-29 ENCOUNTER — Other Ambulatory Visit: Payer: Self-pay | Admitting: Internal Medicine

## 2014-07-03 ENCOUNTER — Other Ambulatory Visit: Payer: Self-pay | Admitting: *Deleted

## 2014-07-03 ENCOUNTER — Ambulatory Visit (INDEPENDENT_AMBULATORY_CARE_PROVIDER_SITE_OTHER): Payer: Managed Care, Other (non HMO) | Admitting: Internal Medicine

## 2014-07-03 ENCOUNTER — Encounter: Payer: Self-pay | Admitting: Internal Medicine

## 2014-07-03 VITALS — BP 138/80 | HR 70 | Temp 98.0°F | Resp 20 | Ht 69.75 in | Wt 208.0 lb

## 2014-07-03 DIAGNOSIS — Z0000 Encounter for general adult medical examination without abnormal findings: Secondary | ICD-10-CM

## 2014-07-03 DIAGNOSIS — I10 Essential (primary) hypertension: Secondary | ICD-10-CM

## 2014-07-03 LAB — COMPREHENSIVE METABOLIC PANEL
ALBUMIN: 3.9 g/dL (ref 3.5–5.2)
ALK PHOS: 51 U/L (ref 39–117)
ALT: 18 U/L (ref 0–53)
AST: 17 U/L (ref 0–37)
BUN: 13 mg/dL (ref 6–23)
CO2: 31 meq/L (ref 19–32)
Calcium: 8.9 mg/dL (ref 8.4–10.5)
Chloride: 106 mEq/L (ref 96–112)
Creatinine, Ser: 1.08 mg/dL (ref 0.40–1.50)
GFR: 90.53 mL/min (ref >60.00)
Glucose, Bld: 99 mg/dL (ref 70–99)
POTASSIUM: 3.9 meq/L (ref 3.5–5.1)
Sodium: 140 mEq/L (ref 135–145)
TOTAL PROTEIN: 6.4 g/dL (ref 6.0–8.3)
Total Bilirubin: 0.7 mg/dL (ref 0.2–1.2)

## 2014-07-03 LAB — CBC WITH DIFFERENTIAL/PLATELET
BASOS ABS: 0 10*3/uL (ref 0.0–0.1)
Basophils Relative: 0.5 % (ref 0.0–3.0)
EOS ABS: 0.4 10*3/uL (ref 0.0–0.7)
Eosinophils Relative: 7.8 % — ABNORMAL HIGH (ref 0.0–5.0)
HCT: 43.7 % (ref 39.0–52.0)
HEMOGLOBIN: 15.2 g/dL (ref 13.0–17.0)
LYMPHS ABS: 1.5 10*3/uL (ref 0.7–4.0)
Lymphocytes Relative: 29.7 % (ref 12.0–46.0)
MCHC: 34.8 g/dL (ref 30.0–36.0)
MCV: 87.2 fl (ref 78.0–100.0)
Monocytes Absolute: 0.7 10*3/uL (ref 0.1–1.0)
Monocytes Relative: 14 % — ABNORMAL HIGH (ref 3.0–12.0)
Neutro Abs: 2.3 10*3/uL (ref 1.4–7.7)
Neutrophils Relative %: 48 % (ref 43.0–77.0)
Platelets: 211 10*3/uL (ref 150.0–400.0)
RBC: 5.01 Mil/uL (ref 4.22–5.81)
RDW: 13.2 % (ref 11.5–15.5)
WBC: 4.9 10*3/uL (ref 4.0–10.5)

## 2014-07-03 LAB — TSH: TSH: 0.7 u[IU]/mL (ref 0.35–4.50)

## 2014-07-03 LAB — LIPID PANEL
Cholesterol: 150 mg/dL (ref 0–200)
HDL: 46.8 mg/dL (ref >39.00)
LDL Cholesterol: 96 mg/dL (ref 0–99)
NonHDL: 103.2
TRIGLYCERIDES: 36 mg/dL (ref 0.0–149.0)
Total CHOL/HDL Ratio: 3
VLDL: 7.2 mg/dL (ref 0.0–40.0)

## 2014-07-03 MED ORDER — BENAZEPRIL-HYDROCHLOROTHIAZIDE 20-12.5 MG PO TABS: 1.0000 | ORAL_TABLET | Freq: Every day | ORAL | Status: DC

## 2014-07-03 NOTE — Patient Instructions (Signed)
Limit your sodium (Salt) intake    It is important that you exercise regularly, at least 20 minutes 3 to 4 times per week.  If you develop chest pain or shortness of breath seek  medical attention.  Please check your blood pressure on a regular basis.  If it is consistently greater than 150/90, please make an office appointment.  Health Maintenance A healthy lifestyle and preventative care can promote health and wellness.  Maintain regular health, dental, and eye exams.  Eat a healthy diet. Foods like vegetables, fruits, whole grains, low-fat dairy products, and lean protein foods contain the nutrients you need and are low in calories. Decrease your intake of foods high in solid fats, added sugars, and salt. Get information about a proper diet from your health care provider, if necessary.  Regular physical exercise is one of the most important things you can do for your health. Most adults should get at least 150 minutes of moderate-intensity exercise (any activity that increases your heart rate and causes you to sweat) each week. In addition, most adults need muscle-strengthening exercises on 2 or more days a week.   Maintain a healthy weight. The body mass index (BMI) is a screening tool to identify possible weight problems. It provides an estimate of body fat based on height and weight. Your health care provider can find your BMI and can help you achieve or maintain a healthy weight. For males 20 years and older:  A BMI below 18.5 is considered underweight.  A BMI of 18.5 to 24.9 is normal.  A BMI of 25 to 29.9 is considered overweight.  A BMI of 30 and above is considered obese.  Maintain normal blood lipids and cholesterol by exercising and minimizing your intake of saturated fat. Eat a balanced diet with plenty of fruits and vegetables. Blood tests for lipids and cholesterol should begin at age 20 and be repeated every 5 years. If your lipid or cholesterol levels are high, you are  over age 50, or you are at high risk for heart disease, you may need your cholesterol levels checked more frequently.Ongoing high lipid and cholesterol levels should be treated with medicines if diet and exercise are not working.  If you smoke, find out from your health care provider how to quit. If you do not use tobacco, do not start.  Lung cancer screening is recommended for adults aged 55-80 years who are at high risk for developing lung cancer because of a history of smoking. A yearly low-dose CT scan of the lungs is recommended for people who have at least a 30-pack-year history of smoking and are current smokers or have quit within the past 15 years. A pack year of smoking is smoking an average of 1 pack of cigarettes a day for 1 year (for example, a 30-pack-year history of smoking could mean smoking 1 pack a day for 30 years or 2 packs a day for 15 years). Yearly screening should continue until the smoker has stopped smoking for at least 15 years. Yearly screening should be stopped for people who develop a health problem that would prevent them from having lung cancer treatment.  If you choose to drink alcohol, do not have more than 2 drinks per day. One drink is considered to be 12 oz (360 mL) of beer, 5 oz (150 mL) of wine, or 1.5 oz (45 mL) of liquor.  Avoid the use of street drugs. Do not share needles with anyone. Ask for help if you   need support or instructions about stopping the use of drugs.  High blood pressure causes heart disease and increases the risk of stroke. Blood pressure should be checked at least every 1-2 years. Ongoing high blood pressure should be treated with medicines if weight loss and exercise are not effective.  If you are 45-79 years old, ask your health care provider if you should take aspirin to prevent heart disease.  Diabetes screening involves taking a blood sample to check your fasting blood sugar level. This should be done once every 3 years after age 45 if  you are at a normal weight and without risk factors for diabetes. Testing should be considered at a younger age or be carried out more frequently if you are overweight and have at least 1 risk factor for diabetes.  Colorectal cancer can be detected and often prevented. Most routine colorectal cancer screening begins at the age of 50 and continues through age 75. However, your health care provider may recommend screening at an earlier age if you have risk factors for colon cancer. On a yearly basis, your health care provider may provide home test kits to check for hidden blood in the stool. A small camera at the end of a tube may be used to directly examine the colon (sigmoidoscopy or colonoscopy) to detect the earliest forms of colorectal cancer. Talk to your health care provider about this at age 50 when routine screening begins. A direct exam of the colon should be repeated every 5-10 years through age 75, unless early forms of precancerous polyps or small growths are found.  People who are at an increased risk for hepatitis B should be screened for this virus. You are considered at high risk for hepatitis B if:  You were born in a country where hepatitis B occurs often. Talk with your health care provider about which countries are considered high risk.  Your parents were born in a high-risk country and you have not received a shot to protect against hepatitis B (hepatitis B vaccine).  You have HIV or AIDS.  You use needles to inject street drugs.  You live with, or have sex with, someone who has hepatitis B.  You are a man who has sex with other men (MSM).  You get hemodialysis treatment.  You take certain medicines for conditions like cancer, organ transplantation, and autoimmune conditions.  Hepatitis C blood testing is recommended for all people born from 1945 through 1965 and any individual with known risk factors for hepatitis C.  Healthy men should no longer receive prostate-specific  antigen (PSA) blood tests as part of routine cancer screening. Talk to your health care provider about prostate cancer screening.  Testicular cancer screening is not recommended for adolescents or adult males who have no symptoms. Screening includes self-exam, a health care provider exam, and other screening tests. Consult with your health care provider about any symptoms you have or any concerns you have about testicular cancer.  Practice safe sex. Use condoms and avoid high-risk sexual practices to reduce the spread of sexually transmitted infections (STIs).  You should be screened for STIs, including gonorrhea and chlamydia if:  You are sexually active and are younger than 24 years.  You are older than 24 years, and your health care provider tells you that you are at risk for this type of infection.  Your sexual activity has changed since you were last screened, and you are at an increased risk for chlamydia or gonorrhea. Ask your health   care provider if you are at risk.  If you are at risk of being infected with HIV, it is recommended that you take a prescription medicine daily to prevent HIV infection. This is called pre-exposure prophylaxis (PrEP). You are considered at risk if:  You are a man who has sex with other men (MSM).  You are a heterosexual man who is sexually active with multiple partners.  You take drugs by injection.  You are sexually active with a partner who has HIV.  Talk with your health care provider about whether you are at high risk of being infected with HIV. If you choose to begin PrEP, you should first be tested for HIV. You should then be tested every 3 months for as long as you are taking PrEP.  Use sunscreen. Apply sunscreen liberally and repeatedly throughout the day. You should seek shade when your shadow is shorter than you. Protect yourself by wearing long sleeves, pants, a wide-brimmed hat, and sunglasses year round whenever you are outdoors.  Tell  your health care provider of new moles or changes in moles, especially if there is a change in shape or color. Also, tell your health care provider if a mole is larger than the size of a pencil eraser.  A one-time screening for abdominal aortic aneurysm (AAA) and surgical repair of large AAAs by ultrasound is recommended for men aged 65-75 years who are current or former smokers.  Stay current with your vaccines (immunizations). Document Released: 10/21/2007 Document Revised: 04/29/2013 Document Reviewed: 09/19/2010 [employer] Patient Information 2015 [employer]. This information is not intended to replace advice given to you by your health care provider. Make sure you discuss any questions you have with your health care provider.  

## 2014-07-03 NOTE — Progress Notes (Signed)
Pre visit review using our clinic review tool, if applicable. No additional management support is needed unless otherwise documented below in the visit note. 

## 2014-07-03 NOTE — Progress Notes (Signed)
Patient ID: Jonathan Marks, male   DOB: Aug 17, 1955, 59 y.o.   MRN: [id number]  Subjective:    Patient ID: Jonathan Marks, male    DOB: Dec 09, 1955, 59 y.o.   MRN: [id number]  HPI 25 -year-old patient who is seen today for a wellness exam.  He has treated hypertension and enjoys excellent health. No concerns or complaints. He did have a screening colonoscopy in 2007.  No concerns or complaints today. He remained active at  work with the physical activity throughout the day   Past Medical History  Diagnosis Date  . Unspecified essential hypertension 01/21/2007  . Prostatitis     History   Social History  . Marital Status: Married    Spouse Name: N/A  . Number of Children: N/A  . Years of Education: N/A   Occupational History  . Not on file.   Social History Main Topics  . Smoking status: Former Research scientist (life sciences)  . Smokeless tobacco: Not on file  . Alcohol Use: No  . Drug Use: Not on file  . Sexual Activity: Not on file     Comment: 1 daughter, 2 grandchildren   Other Topics Concern  . Not on file   Social History Narrative    Past Surgical History  Procedure Laterality Date  . Inguinal hernia repair  2001    left , Dr.Weatherly  . Colonoscopy      age 57  .  left plantar fasciotomy  October 2012  . Hernia repair      Family History  Problem Relation Age of Onset  . Hypertension Mother   . Alcohol abuse Father   . Stroke Father   . Alcohol abuse Other   . Hypertension Other   . Stroke Father   . Pancreatic cancer Sister     No Known Allergies  Current Outpatient Prescriptions on File Prior to Visit  Medication Sig Dispense Refill  . benazepril-hydrochlorthiazide (LOTENSIN HCT) 20-12.5 MG per tablet TAKE 1 TABLET BY MOUTH DAILY. 90 tablet 1   No current facility-administered medications on file prior to visit.    BP 138/80 mmHg  Pulse 70  Temp(Src) 98 F (36.7 C) (Oral)  Resp 20  Ht 5' 9.75" (1.772 m)  Wt 208 lb (94.348 kg)  BMI 30.05 kg/m2  SpO2  98%      Review of Systems  Constitutional: Negative for fever, chills, activity change, appetite change and fatigue.  HENT: Negative for congestion, dental problem, ear pain, hearing loss, mouth sores, rhinorrhea, sinus pressure, sneezing, tinnitus, trouble swallowing and voice change.   Eyes: Negative for photophobia, pain, redness and visual disturbance.  Respiratory: Negative for apnea, cough, choking, chest tightness, shortness of breath and wheezing.   Cardiovascular: Negative for chest pain, palpitations and leg swelling.  Gastrointestinal: Negative for nausea, vomiting, abdominal pain, diarrhea, constipation, blood in stool, abdominal distention, anal bleeding and rectal pain.  Genitourinary: Negative for dysuria, urgency, frequency, hematuria, flank pain, decreased urine volume, discharge, penile swelling, scrotal swelling, difficulty urinating, genital sores and testicular pain.  Musculoskeletal: Negative for myalgias, back pain, joint swelling, arthralgias, gait problem, neck pain and neck stiffness.  Skin: Negative for color change, rash and wound.  Neurological: Negative for dizziness, tremors, seizures, syncope, facial asymmetry, speech difficulty, weakness, light-headedness, numbness and headaches.  Hematological: Negative for adenopathy. Does not bruise/bleed easily.  Psychiatric/Behavioral: Negative for suicidal ideas, hallucinations, behavioral problems, confusion, sleep disturbance, self-injury, dysphoric mood, decreased concentration and agitation. The patient is not nervous/anxious.  Objective:   Physical Exam  Constitutional: He appears well-developed and well-nourished.  HENT:  Head: Normocephalic and atraumatic.  Right Ear: External ear normal.  Left Ear: External ear normal.  Nose: Nose normal.  Mouth/Throat: Oropharynx is clear and moist.  Eyes: Conjunctivae and EOM are normal. Pupils are equal, round, and reactive to light. No scleral icterus.  Neck:  Normal range of motion. Neck supple. No JVD present. No thyromegaly present.  Cardiovascular: Regular rhythm, normal heart sounds and intact distal pulses.  Exam reveals no gallop and no friction rub.   No murmur heard. Pulmonary/Chest: Effort normal and breath sounds normal. He exhibits no tenderness.  Abdominal: Soft. Bowel sounds are normal. He exhibits no distension and no mass. There is no tenderness.  Genitourinary: Prostate normal and penis normal.  Musculoskeletal: Normal range of motion. He exhibits no edema or tenderness.  A bony nodule just inferior to the left patella  Lymphadenopathy:    He has no cervical adenopathy.  Neurological: He is alert. He has normal reflexes. No cranial nerve deficit. Coordination normal.  Skin: Skin is warm and dry. No rash noted.  Psychiatric: He has a normal mood and affect. His behavior is normal.          Assessment & Plan:   Preventive health examination Hypertension  Low-salt diet recommended Regular exercise encouraged Home blood pressure monitoring encouraged  Recheck one year

## 2014-07-07 ENCOUNTER — Telehealth: Payer: Self-pay | Admitting: Internal Medicine

## 2014-07-07 NOTE — Telephone Encounter (Signed)
emmi mailed  °

## 2015-07-23 ENCOUNTER — Other Ambulatory Visit: Payer: Self-pay | Admitting: Internal Medicine

## 2015-09-07 ENCOUNTER — Encounter: Payer: Self-pay | Admitting: Gastroenterology

## 2015-12-03 ENCOUNTER — Ambulatory Visit (INDEPENDENT_AMBULATORY_CARE_PROVIDER_SITE_OTHER): Payer: Managed Care, Other (non HMO) | Admitting: Internal Medicine

## 2015-12-03 ENCOUNTER — Encounter: Payer: Self-pay | Admitting: Internal Medicine

## 2015-12-03 VITALS — BP 140/84 | HR 75 | Temp 98.5°F | Ht 69.0 in | Wt 208.0 lb

## 2015-12-03 DIAGNOSIS — Z0000 Encounter for general adult medical examination without abnormal findings: Secondary | ICD-10-CM | POA: Diagnosis not present

## 2015-12-03 DIAGNOSIS — I10 Essential (primary) hypertension: Secondary | ICD-10-CM | POA: Diagnosis not present

## 2015-12-03 LAB — COMPREHENSIVE METABOLIC PANEL
ALBUMIN: 4.1 g/dL (ref 3.5–5.2)
ALK PHOS: 45 U/L (ref 39–117)
ALT: 18 U/L (ref 0–53)
AST: 17 U/L (ref 0–37)
BILIRUBIN TOTAL: 0.8 mg/dL (ref 0.2–1.2)
BUN: 15 mg/dL (ref 6–23)
CALCIUM: 9.1 mg/dL (ref 8.4–10.5)
CO2: 30 meq/L (ref 19–32)
CREATININE: 1.09 mg/dL (ref 0.40–1.50)
Chloride: 106 mEq/L (ref 96–112)
GFR: 89.13 mL/min (ref >60.00)
Glucose, Bld: 96 mg/dL (ref 70–99)
Potassium: 3.7 mEq/L (ref 3.5–5.1)
Sodium: 140 mEq/L (ref 135–145)
TOTAL PROTEIN: 6.4 g/dL (ref 6.0–8.3)

## 2015-12-03 LAB — TSH: TSH: 1.02 u[IU]/mL (ref 0.35–4.50)

## 2015-12-03 LAB — LIPID PANEL
CHOL/HDL RATIO: 3
Cholesterol: 161 mg/dL (ref 0–200)
HDL: 49 mg/dL (ref >39.00)
LDL Cholesterol: 102 mg/dL — ABNORMAL HIGH (ref 0–99)
NonHDL: 111.55
Triglycerides: 47 mg/dL (ref 0.0–149.0)
VLDL: 9.4 mg/dL (ref 0.0–40.0)

## 2015-12-03 LAB — CBC WITH DIFFERENTIAL/PLATELET
BASOS ABS: 0 10*3/uL (ref 0.0–0.1)
Basophils Relative: 0.6 % (ref 0.0–3.0)
EOS ABS: 0.5 10*3/uL (ref 0.0–0.7)
Eosinophils Relative: 10.5 % — ABNORMAL HIGH (ref 0.0–5.0)
HEMATOCRIT: 41.6 % (ref 39.0–52.0)
Hemoglobin: 14.6 g/dL (ref 13.0–17.0)
LYMPHS PCT: 35.3 % (ref 12.0–46.0)
Lymphs Abs: 1.8 10*3/uL (ref 0.7–4.0)
MCHC: 35 g/dL (ref 30.0–36.0)
MCV: 86.6 fl (ref 78.0–100.0)
MONOS PCT: 12.9 % — AB (ref 3.0–12.0)
Monocytes Absolute: 0.7 10*3/uL (ref 0.1–1.0)
NEUTROS ABS: 2.1 10*3/uL (ref 1.4–7.7)
NEUTROS PCT: 40.7 % — AB (ref 43.0–77.0)
PLATELETS: 226 10*3/uL (ref 150.0–400.0)
RBC: 4.8 Mil/uL (ref 4.22–5.81)
RDW: 14 % (ref 11.5–15.5)
WBC: 5.1 10*3/uL (ref 4.0–10.5)

## 2015-12-03 LAB — PSA: PSA: 3.11 ng/mL (ref 0.10–4.00)

## 2015-12-03 MED ORDER — BENAZEPRIL-HYDROCHLOROTHIAZIDE 20-12.5 MG PO TABS: 1.0000 | ORAL_TABLET | Freq: Every day | ORAL | 6 refills | Status: DC

## 2015-12-03 NOTE — Progress Notes (Signed)
Patient ID: Jonathan Marks, male   DOB: 1956-01-08, 60 y.o.   MRN: [id number]  Subjective:    Patient ID: Jonathan Marks, male    DOB: 09-Mar-1956, 60 y.o.   MRN: [id number]  HPI 60  -year-old patient who is seen today for a wellness exam.  He has treated hypertension and enjoys excellent health. No concerns or complaints. He did have a screening colonoscopy in 2007.  No concerns or complaints today. He remained active at  work with the physical activity throughout the day   Past Medical History:  Diagnosis Date  . Prostatitis   . Unspecified essential hypertension 01/21/2007    Social History   Social History  . Marital status: Married    Spouse name: N/A  . Number of children: N/A  . Years of education: N/A   Occupational History  . Not on file.   Social History Main Topics  . Smoking status: Former Research scientist (life sciences)  . Smokeless tobacco: Not on file  . Alcohol use No  . Drug use: Unknown  . Sexual activity: Not on file     Comment: 1 daughter, 2 grandchildren   Other Topics Concern  . Not on file   Social History Narrative  . No narrative on file    Past Surgical History:  Procedure Laterality Date  .  left plantar fasciotomy  October 2012  . COLONOSCOPY     age 14  . HERNIA REPAIR    . INGUINAL HERNIA REPAIR  2001   left , Dr.Weatherly    Family History  Problem Relation Age of Onset  . Hypertension Mother   . Alcohol abuse Father   . Stroke Father   . Alcohol abuse Other   . Hypertension Other   . Stroke Father   . Pancreatic cancer Sister     No Known Allergies  Current Outpatient Prescriptions on File Prior to Visit  Medication Sig Dispense Refill  . benazepril-hydrochlorthiazide (LOTENSIN HCT) 20-12.5 MG tablet TAKE 1 TABLET BY MOUTH DAILY. 90 tablet 1   No current facility-administered medications on file prior to visit.     Temp 98.5 F (36.9 C) (Oral)   Ht 5\' 9"  (1.753 m)   Wt 208 lb (94.3 kg)   BMI 30.72 kg/m       Review of Systems   Constitutional: Negative for activity change, appetite change, chills, fatigue and fever.  HENT: Negative for congestion, dental problem, ear pain, hearing loss, mouth sores, rhinorrhea, sinus pressure, sneezing, tinnitus, trouble swallowing and voice change.   Eyes: Negative for photophobia, pain, redness and visual disturbance.  Respiratory: Negative for apnea, cough, choking, chest tightness, shortness of breath and wheezing.   Cardiovascular: Negative for chest pain, palpitations and leg swelling.  Gastrointestinal: Negative for abdominal distention, abdominal pain, anal bleeding, blood in stool, constipation, diarrhea, nausea, rectal pain and vomiting.  Genitourinary: Negative for decreased urine volume, difficulty urinating, discharge, dysuria, flank pain, frequency, genital sores, hematuria, penile swelling, scrotal swelling, testicular pain and urgency.  Musculoskeletal: Negative for arthralgias, back pain, gait problem, joint swelling, myalgias, neck pain and neck stiffness.  Skin: Negative for color change, rash and wound.  Neurological: Negative for dizziness, tremors, seizures, syncope, facial asymmetry, speech difficulty, weakness, light-headedness, numbness and headaches.  Hematological: Negative for adenopathy. Does not bruise/bleed easily.  Psychiatric/Behavioral: Negative for agitation, behavioral problems, confusion, decreased concentration, dysphoric mood, hallucinations, self-injury, sleep disturbance and suicidal ideas. The patient is not nervous/anxious.        Objective:  Physical Exam  Constitutional: He appears well-developed and well-nourished.  HENT:  Head: Normocephalic and atraumatic.  Right Ear: External ear normal.  Left Ear: External ear normal.  Nose: Nose normal.  Mouth/Throat: Oropharynx is clear and moist.  Eyes: Conjunctivae and EOM are normal. Pupils are equal, round, and reactive to light. No scleral icterus.  Neck: Normal range of motion. Neck  supple. No JVD present. No thyromegaly present.  Cardiovascular: Regular rhythm, normal heart sounds and intact distal pulses.  Exam reveals no gallop and no friction rub.   No murmur heard. Pulmonary/Chest: Effort normal and breath sounds normal. He exhibits no tenderness.  Abdominal: Soft. Bowel sounds are normal. He exhibits no distension and no mass. There is no tenderness.  Genitourinary: Prostate normal and penis normal.  Musculoskeletal: Normal range of motion. He exhibits no edema or tenderness.  A bony nodule just inferior to the left patella  Lymphadenopathy:    He has no cervical adenopathy.  Neurological: He is alert. He has normal reflexes. No cranial nerve deficit. Coordination normal.  Skin: Skin is warm and dry. No rash noted.  Psychiatric: He has a normal mood and affect. His behavior is normal.          Assessment & Plan:   Preventive health examination Hypertension  Low-salt diet recommended Regular exercise encouraged Home blood pressure monitoring encouraged  Recheck one year.  Will schedule follow-up colonoscopy at that time  [name], MD

## 2015-12-03 NOTE — Progress Notes (Signed)
Pre visit review using our clinic review tool, if applicable. No additional management support is needed unless otherwise documented below in the visit note. 

## 2015-12-03 NOTE — Patient Instructions (Addendum)
Limit your sodium (Salt) intake  Please check your blood pressure on a regular basis.  If it is consistently greater than 150/90, please make an office appointment.    It is important that you exercise regularly, at least 20 minutes 3 to 4 times per week.  If you develop chest pain or shortness of breath seek  medical attention.  Health Maintenance, Male A healthy lifestyle and preventative care can promote health and wellness.  Maintain regular health, dental, and eye exams.  Eat a healthy diet. Foods like vegetables, fruits, whole grains, low-fat dairy products, and lean protein foods contain the nutrients you need and are low in calories. Decrease your intake of foods high in solid fats, added sugars, and salt. Get information about a proper diet from your health care provider, if necessary.  Regular physical exercise is one of the most important things you can do for your health. Most adults should get at least 150 minutes of moderate-intensity exercise (any activity that increases your heart rate and causes you to sweat) each week. In addition, most adults need muscle-strengthening exercises on 2 or more days a week.   Maintain a healthy weight. The body mass index (BMI) is a screening tool to identify possible weight problems. It provides an estimate of body fat based on height and weight. Your health care provider can find your BMI and can help you achieve or maintain a healthy weight. For males 20 years and older:  A BMI below 18.5 is considered underweight.  A BMI of 18.5 to 24.9 is normal.  A BMI of 25 to 29.9 is considered overweight.  A BMI of 30 and above is considered obese.  Maintain normal blood lipids and cholesterol by exercising and minimizing your intake of saturated fat. Eat a balanced diet with plenty of fruits and vegetables. Blood tests for lipids and cholesterol should begin at age 52 and be repeated every 5 years. If your lipid or cholesterol levels are high, you  are over age 32, or you are at high risk for heart disease, you may need your cholesterol levels checked more frequently.Ongoing high lipid and cholesterol levels should be treated with medicines if diet and exercise are not working.  If you smoke, find out from your health care provider how to quit. If you do not use tobacco, do not start.  Lung cancer screening is recommended for adults aged 76-80 years who are at high risk for developing lung cancer because of a history of smoking. A yearly low-dose CT scan of the lungs is recommended for people who have at least a 30-pack-year history of smoking and are current smokers or have quit within the past 15 years. A pack year of smoking is smoking an average of 1 pack of cigarettes a day for 1 year (for example, a 30-pack-year history of smoking could mean smoking 1 pack a day for 30 years or 2 packs a day for 15 years). Yearly screening should continue until the smoker has stopped smoking for at least 15 years. Yearly screening should be stopped for people who develop a health problem that would prevent them from having lung cancer treatment.  If you choose to drink alcohol, do not have more than 2 drinks per day. One drink is considered to be 12 oz (360 mL) of beer, 5 oz (150 mL) of wine, or 1.5 oz (45 mL) of liquor.  Avoid the use of street drugs. Do not share needles with anyone. Ask for help if  you need support or instructions about stopping the use of drugs.  High blood pressure causes heart disease and increases the risk of stroke. High blood pressure is more likely to develop in:  People who have blood pressure in the end of the normal range (100-139/85-89 mm Hg).  People who are overweight or obese.  People who are African American.  If you are 105-99 years of age, have your blood pressure checked every 3-5 years. If you are 83 years of age or older, have your blood pressure checked every year. You should have your blood pressure measured  twice--once when you are at a hospital or clinic, and once when you are not at a hospital or clinic. Record the average of the two measurements. To check your blood pressure when you are not at a hospital or clinic, you can use:  An automated blood pressure machine at a pharmacy.  A home blood pressure monitor.  If you are 4-63 years old, ask your health care provider if you should take aspirin to prevent heart disease.  Diabetes screening involves taking a blood sample to check your fasting blood sugar level. This should be done once every 3 years after age 53 if you are at a normal weight and without risk factors for diabetes. Testing should be considered at a younger age or be carried out more frequently if you are overweight and have at least 1 risk factor for diabetes.  Colorectal cancer can be detected and often prevented. Most routine colorectal cancer screening begins at the age of 21 and continues through age 57. However, your health care provider may recommend screening at an earlier age if you have risk factors for colon cancer. On a yearly basis, your health care provider may provide home test kits to check for hidden blood in the stool. A small camera at the end of a tube may be used to directly examine the colon (sigmoidoscopy or colonoscopy) to detect the earliest forms of colorectal cancer. Talk to your health care provider about this at age 54 when routine screening begins. A direct exam of the colon should be repeated every 5-10 years through age 30, unless early forms of precancerous polyps or small growths are found.  People who are at an increased risk for hepatitis B should be screened for this virus. You are considered at high risk for hepatitis B if:  You were born in a country where hepatitis B occurs often. Talk with your health care provider about which countries are considered high risk.  Your parents were born in a high-risk country and you have not received a shot to  protect against hepatitis B (hepatitis B vaccine).  You have HIV or AIDS.  You use needles to inject street drugs.  You live with, or have sex with, someone who has hepatitis B.  You are a man who has sex with other men (MSM).  You get hemodialysis treatment.  You take certain medicines for conditions like cancer, organ transplantation, and autoimmune conditions.  Hepatitis C blood testing is recommended for all people born from [birth year] through 1964 and any individual with known risk factors for hepatitis C.  Healthy men should no longer receive prostate-specific antigen (PSA) blood tests as part of routine cancer screening. Talk to your health care provider about prostate cancer screening.  Testicular cancer screening is not recommended for adolescents or adult males who have no symptoms. Screening includes self-exam, a health care provider exam, and other screening tests. Consult  with your health care provider about any symptoms you have or any concerns you have about testicular cancer.  Practice safe sex. Use condoms and avoid high-risk sexual practices to reduce the spread of sexually transmitted infections (STIs).  You should be screened for STIs, including gonorrhea and chlamydia if:  You are sexually active and are younger than 24 years.  You are older than 24 years, and your health care provider tells you that you are at risk for this type of infection.  Your sexual activity has changed since you were last screened, and you are at an increased risk for chlamydia or gonorrhea. Ask your health care provider if you are at risk.  If you are at risk of being infected with HIV, it is recommended that you take a prescription medicine daily to prevent HIV infection. This is called pre-exposure prophylaxis (PrEP). You are considered at risk if:  You are a man who has sex with other men (MSM).  You are a heterosexual man who is sexually active with multiple partners.  You take drugs by  injection.  You are sexually active with a partner who has HIV.  Talk with your health care provider about whether you are at high risk of being infected with HIV. If you choose to begin PrEP, you should first be tested for HIV. You should then be tested every 3 months for as long as you are taking PrEP.  Use sunscreen. Apply sunscreen liberally and repeatedly throughout the day. You should seek shade when your shadow is shorter than you. Protect yourself by wearing long sleeves, pants, a wide-brimmed hat, and sunglasses year round whenever you are outdoors.  Tell your health care provider of new moles or changes in moles, especially if there is a change in shape or color. Also, tell your health care provider if a mole is larger than the size of a pencil eraser.  A one-time screening for abdominal aortic aneurysm (AAA) and surgical repair of large AAAs by ultrasound is recommended for men aged 40-75 years who are current or former smokers.  Stay current with your vaccines (immunizations).   This information is not intended to replace advice given to you by your health care provider. Make sure you discuss any questions you have with your health care provider.   Document Released: 10/21/2007 Document Revised: 05/15/2014 Document Reviewed: 09/19/2010 Elsevier Interactive Patient Education [employer].

## 2016-06-19 ENCOUNTER — Encounter: Payer: Self-pay | Admitting: Internal Medicine

## 2016-06-19 ENCOUNTER — Ambulatory Visit (INDEPENDENT_AMBULATORY_CARE_PROVIDER_SITE_OTHER): Payer: Managed Care, Other (non HMO) | Admitting: Internal Medicine

## 2016-06-19 VITALS — BP 146/78 | HR 104 | Temp 98.9°F | Ht 69.0 in | Wt 213.0 lb

## 2016-06-19 DIAGNOSIS — J111 Influenza due to unidentified influenza virus with other respiratory manifestations: Secondary | ICD-10-CM | POA: Diagnosis not present

## 2016-06-19 DIAGNOSIS — I10 Essential (primary) hypertension: Secondary | ICD-10-CM

## 2016-06-19 LAB — POCT INFLUENZA A/B

## 2016-06-19 MED ORDER — HYDROCODONE-ACETAMINOPHEN 5-325 MG PO TABS: 1.0000 | ORAL_TABLET | Freq: Four times a day (QID) | ORAL | 0 refills | Status: DC | PRN

## 2016-06-19 MED ORDER — BENAZEPRIL-HYDROCHLOROTHIAZIDE 20-12.5 MG PO TABS: 1.0000 | ORAL_TABLET | Freq: Every day | ORAL | 6 refills | Status: DC

## 2016-06-19 NOTE — Progress Notes (Signed)
Subjective:    Patient ID: Jonathan Marks, male    DOB: Sep 15, 1955, 61 y.o.   MRN: [id number]  HPI 61 year old patient who presents with a 2 to three-day history of fever, body aches, headache, cough and sore throat.  He has had some intermittent low-grade fever.  No pertinent exposure or travel history.  No chills.  No chest pain or shortness of breath.  No purulent productive cough  Past Medical History:  Diagnosis Date  . Prostatitis   . Unspecified essential hypertension 01/21/2007     Social History   Social History  . Marital status: Married    Spouse name: N/A  . Number of children: N/A  . Years of education: N/A   Occupational History  . Not on file.   Social History Main Topics  . Smoking status: Former Research scientist (life sciences)  . Smokeless tobacco: Not on file  . Alcohol use No  . Drug use: Unknown  . Sexual activity: Not on file     Comment: 1 daughter, 2 grandchildren   Other Topics Concern  . Not on file   Social History Narrative  . No narrative on file    Past Surgical History:  Procedure Laterality Date  .  left plantar fasciotomy  October 2012  . COLONOSCOPY     age 61  . HERNIA REPAIR    . INGUINAL HERNIA REPAIR  2001   left , Dr.Weatherly    Family History  Problem Relation Age of Onset  . Hypertension Mother   . Alcohol abuse Father   . Stroke Father   . Alcohol abuse Other   . Hypertension Other   . Stroke Father   . Pancreatic cancer Sister     No Known Allergies  Current Outpatient Prescriptions on File Prior to Visit  Medication Sig Dispense Refill  . benazepril-hydrochlorthiazide (LOTENSIN HCT) 20-12.5 MG tablet Take 1 tablet by mouth daily. 90 tablet 6   No current facility-administered medications on file prior to visit.     BP (!) 146/78 (BP Location: Right Arm, Patient Position: Sitting, Cuff Size: Normal)   Pulse (!) 104   Temp 98.9 F (37.2 C) (Oral)   Ht 5\' 9"  (1.753 m)   Wt 213 lb (96.6 kg)   SpO2 97%   BMI 31.45 kg/m       Review of Systems  Constitutional: Positive for activity change, appetite change, fatigue and fever. Negative for chills.  HENT: Positive for sore throat. Negative for congestion, dental problem, ear pain, hearing loss, tinnitus, trouble swallowing and voice change.   Eyes: Negative for pain, discharge and visual disturbance.  Respiratory: Negative for cough, chest tightness, wheezing and stridor.   Cardiovascular: Negative for chest pain, palpitations and leg swelling.  Gastrointestinal: Negative for abdominal distention, abdominal pain, blood in stool, constipation, diarrhea, nausea and vomiting.  Genitourinary: Negative for difficulty urinating, discharge, flank pain, genital sores, hematuria and urgency.  Musculoskeletal: Positive for myalgias. Negative for arthralgias, back pain, gait problem, joint swelling and neck stiffness.  Skin: Negative for rash.  Neurological: Positive for headaches. Negative for dizziness, syncope, speech difficulty, weakness and numbness.  Hematological: Negative for adenopathy. Does not bruise/bleed easily.  Psychiatric/Behavioral: Negative for behavioral problems and dysphoric mood. The patient is not nervous/anxious.        Objective:   Physical Exam  Constitutional: He is oriented to person, place, and time. He appears well-developed. No distress.  Afebrile Appears unwell, but in no acute distress  HENT:  Head: Normocephalic.  Right Ear: External ear normal.  Left Ear: External ear normal.  Mild erythema of the oropharynx  Eyes: Conjunctivae and EOM are normal.  Neck: Normal range of motion.  Cardiovascular: Normal rate and normal heart sounds.   Pulmonary/Chest: Breath sounds normal.  Abdominal: Bowel sounds are normal.  Musculoskeletal: Normal range of motion. He exhibits no edema or tenderness.  Neurological: He is alert and oriented to person, place, and time.  Psychiatric: He has a normal mood and affect. His behavior is normal.           Assessment & Plan:   Flu syndrome.  Will check for influenza a/B.  Treat symptomatically.  Will treat with Tamiflu.  If positive Essential hypertension  CPX as scheduled August 2018  Jonathan Marks

## 2016-06-19 NOTE — Progress Notes (Signed)
Pre visit review using our clinic review tool, if applicable. No additional management support is needed unless otherwise documented below in the visit note. 

## 2016-06-19 NOTE — Patient Instructions (Addendum)
Take over-the-counter expectorants and cough medications such as  Mucinex DM.  Call if there is no improvement in 5 to 7 days or if  you develop worsening cough, fever, or new symptoms, such as shortness of breath or chest pain.  Drink as much fluid as you  can tolerate over the next few days  Hydrate and Humidify  Drink enough water to keep your urine clear or pale yellow. Staying hydrated will help to thin your mucus.  Use a cool mist humidifier to keep the humidity level in your home above 50%.  Inhale steam for 10-15 minutes, 3-4 times a day or as told by your health care provider. You can do this in the bathroom while a hot shower is running.  Limit your exposure to cool or dry air. Rest  Rest as much as possible.

## 2016-06-21 ENCOUNTER — Ambulatory Visit (INDEPENDENT_AMBULATORY_CARE_PROVIDER_SITE_OTHER): Payer: Managed Care, Other (non HMO) | Admitting: Family Medicine

## 2016-06-21 ENCOUNTER — Encounter: Payer: Self-pay | Admitting: Family Medicine

## 2016-06-21 ENCOUNTER — Ambulatory Visit (INDEPENDENT_AMBULATORY_CARE_PROVIDER_SITE_OTHER)
Admission: RE | Admit: 2016-06-21 | Discharge: 2016-06-21 | Disposition: A | Discharge status: Home / Self Care | Payer: Managed Care, Other (non HMO) | Source: Ambulatory Visit | Attending: Family Medicine | Admitting: Family Medicine

## 2016-06-21 VITALS — BP 140/90 | HR 100 | Temp 99.6°F | Ht 69.0 in | Wt 213.0 lb

## 2016-06-21 DIAGNOSIS — R059 Cough, unspecified: Secondary | ICD-10-CM

## 2016-06-21 DIAGNOSIS — R509 Fever, unspecified: Secondary | ICD-10-CM | POA: Diagnosis not present

## 2016-06-21 DIAGNOSIS — R05 Cough: Secondary | ICD-10-CM

## 2016-06-21 NOTE — Progress Notes (Signed)
Subjective:     Patient ID: Jonathan Marks, male   DOB: Jan 05, 1956, 61 y.o.   MRN: [id number]  HPI Patient seen Monday for presumed viral illness. Influenza screen was negative. He had onset last weekend of some nasal congestion and cough along with chills and body aches. He states he was not having any definite fever then temp or 102 earlier today.Marland Kitchen He still has some cough and myalgias. Increased fatigue. No nausea or vomiting. No dyspnea.  Past Medical History:  Diagnosis Date  . Prostatitis   . Unspecified essential hypertension 01/21/2007   Past Surgical History:  Procedure Laterality Date  .  left plantar fasciotomy  October 2012  . COLONOSCOPY     age 83  . HERNIA REPAIR    . INGUINAL HERNIA REPAIR  2001   left , Dr.Weatherly    reports that he has quit smoking. He has never used smokeless tobacco. He reports that he does not drink alcohol. His drug history is not on file. family history includes Alcohol abuse in his father and other; Hypertension in his mother and other; Pancreatic cancer in his sister; Stroke in his father and father. No Known Allergies]   Review of Systems  Constitutional: Positive for chills, fatigue and fever.  HENT: Positive for congestion.   Respiratory: Positive for cough.        Objective:   Physical Exam  Constitutional: He appears well-developed and well-nourished.  HENT:  Right Ear: External ear normal.  Left Ear: External ear normal.  Mouth/Throat: Oropharynx is clear and moist.  Neck: Neck supple.  Cardiovascular: Normal rate and regular rhythm.   Pulmonary/Chest: Effort normal and breath sounds normal. No respiratory distress. He has no wheezes. He has no rales.  Lymphadenopathy:    He has no cervical adenopathy.  Skin: No rash noted.       Assessment:     Febrile illness. Recent influenza screen negative. Differential is influenza (not picked up on recent screening) versus other viral illness versus other. He does not have any  acute focal findings on exam such as rales and is in no respiratory distress. Nontoxic in appearance    Plan:     -Check CBC with differential and chest x-ray to rule out early pneumonia not auscultated on exam -Continue fluids and plenty of rest. Work note written from today through Saturday -Follow-up immediately for any increased shortness of breath or other concerns -Did not prescribe Tamiflu since he is around day 4 of illness  Eulas Post MD [facility]

## 2016-06-21 NOTE — Progress Notes (Signed)
Pre visit review using our clinic review tool, if applicable. No additional management support is needed unless otherwise documented below in the visit note. 

## 2016-06-21 NOTE — Patient Instructions (Signed)
Go for CXR as discussed  We will call with lab work Drink plenty of fluids Follow up for any shortness of breath or other concerns.

## 2016-06-22 ENCOUNTER — Telehealth: Payer: Self-pay | Admitting: Internal Medicine

## 2016-06-22 LAB — CBC WITH DIFFERENTIAL/PLATELET
BASOS ABS: 0 10*3/uL (ref 0.0–0.1)
Basophils Relative: 0.6 % (ref 0.0–3.0)
Eosinophils Absolute: 0 10*3/uL (ref 0.0–0.7)
Eosinophils Relative: 0.5 % (ref 0.0–5.0)
HCT: 42.5 % (ref 39.0–52.0)
Hemoglobin: 15.2 g/dL (ref 13.0–17.0)
LYMPHS ABS: 1.5 10*3/uL (ref 0.7–4.0)
Lymphocytes Relative: 20.8 % (ref 12.0–46.0)
MCHC: 35.9 g/dL (ref 30.0–36.0)
MCV: 86.2 fl (ref 78.0–100.0)
Monocytes Absolute: 1.5 10*3/uL — ABNORMAL HIGH (ref 0.1–1.0)
Monocytes Relative: 20.5 % — ABNORMAL HIGH (ref 3.0–12.0)
NEUTROS ABS: 4.1 10*3/uL (ref 1.4–7.7)
NEUTROS PCT: 57.6 % (ref 43.0–77.0)
PLATELETS: 182 10*3/uL (ref 150.0–400.0)
RBC: 4.93 Mil/uL (ref 4.22–5.81)
RDW: 13.5 % (ref 11.5–15.5)
WBC: 7.1 10*3/uL (ref 4.0–10.5)

## 2016-06-22 NOTE — Telephone Encounter (Signed)
Pt would like xray results. Pt saw dr Elease Hashimoto yesterday

## 2016-06-23 NOTE — Telephone Encounter (Signed)
Pt is aware of results. 

## 2016-06-23 NOTE — Telephone Encounter (Signed)
Per chart results are not in Epic yet. Spoke with Pauravi at [employer] and she is working on issue. Should have results in about an hour.

## 2016-06-23 NOTE — Telephone Encounter (Signed)
Normal chest x-ray. No pneumonia

## 2016-07-12 LAB — POCT INFLUENZA A/B
INFLUENZA B, POC: NEGATIVE
Influenza A, POC: NEGATIVE

## 2016-07-13 ENCOUNTER — Telehealth: Payer: Self-pay | Admitting: Internal Medicine

## 2016-07-13 NOTE — Telephone Encounter (Signed)
Pt will callback before 5 pm

## 2016-07-13 NOTE — Telephone Encounter (Signed)
Spoke with pt and gave him his lab results.

## 2016-07-13 NOTE — Telephone Encounter (Signed)
Jonathan Marks pt returned the call to get his lab results and state he will call back after 1:00 at his next break he is at work.

## 2016-12-08 ENCOUNTER — Encounter: Payer: Self-pay | Admitting: Internal Medicine

## 2016-12-08 ENCOUNTER — Ambulatory Visit (INDEPENDENT_AMBULATORY_CARE_PROVIDER_SITE_OTHER): Payer: 59 | Admitting: Internal Medicine

## 2016-12-08 ENCOUNTER — Encounter: Payer: Self-pay | Admitting: Gastroenterology

## 2016-12-08 VITALS — BP 148/70 | HR 67 | Temp 98.0°F | Ht 69.0 in | Wt 213.8 lb

## 2016-12-08 DIAGNOSIS — Z0000 Encounter for general adult medical examination without abnormal findings: Secondary | ICD-10-CM

## 2016-12-08 DIAGNOSIS — Z0001 Encounter for general adult medical examination with abnormal findings: Secondary | ICD-10-CM

## 2016-12-08 DIAGNOSIS — I10 Essential (primary) hypertension: Secondary | ICD-10-CM | POA: Diagnosis not present

## 2016-12-08 LAB — CBC WITH DIFFERENTIAL/PLATELET
BASOS PCT: 0.9 % (ref 0.0–3.0)
Basophils Absolute: 0 10*3/uL (ref 0.0–0.1)
EOS PCT: 7 % — AB (ref 0.0–5.0)
Eosinophils Absolute: 0.3 10*3/uL (ref 0.0–0.7)
HCT: 41.9 % (ref 39.0–52.0)
Hemoglobin: 14.7 g/dL (ref 13.0–17.0)
LYMPHS ABS: 1.6 10*3/uL (ref 0.7–4.0)
Lymphocytes Relative: 32.7 % (ref 12.0–46.0)
MCHC: 35 g/dL (ref 30.0–36.0)
MCV: 88.5 fl (ref 78.0–100.0)
MONO ABS: 0.7 10*3/uL (ref 0.1–1.0)
Monocytes Relative: 14.2 % — ABNORMAL HIGH (ref 3.0–12.0)
NEUTROS PCT: 45.2 % (ref 43.0–77.0)
Neutro Abs: 2.2 10*3/uL (ref 1.4–7.7)
Platelets: 204 10*3/uL (ref 150.0–400.0)
RBC: 4.74 Mil/uL (ref 4.22–5.81)
RDW: 13.5 % (ref 11.5–15.5)
WBC: 5 10*3/uL (ref 4.0–10.5)

## 2016-12-08 LAB — COMPREHENSIVE METABOLIC PANEL
ALBUMIN: 3.9 g/dL (ref 3.5–5.2)
ALK PHOS: 39 U/L (ref 39–117)
ALT: 14 U/L (ref 0–53)
AST: 14 U/L (ref 0–37)
BUN: 13 mg/dL (ref 6–23)
CHLORIDE: 107 meq/L (ref 96–112)
CO2: 32 mEq/L (ref 19–32)
Calcium: 8.8 mg/dL (ref 8.4–10.5)
Creatinine, Ser: 1.05 mg/dL (ref 0.40–1.50)
GFR: 92.74 mL/min (ref >60.00)
GLUCOSE: 94 mg/dL (ref 70–99)
POTASSIUM: 4 meq/L (ref 3.5–5.1)
Sodium: 142 mEq/L (ref 135–145)
TOTAL PROTEIN: 6.1 g/dL (ref 6.0–8.3)
Total Bilirubin: 0.6 mg/dL (ref 0.2–1.2)

## 2016-12-08 LAB — TSH: TSH: 0.82 u[IU]/mL (ref 0.35–4.50)

## 2016-12-08 LAB — LIPID PANEL
Cholesterol: 146 mg/dL (ref 0–200)
HDL: 53.7 mg/dL (ref >39.00)
LDL CALC: 86 mg/dL (ref 0–99)
NONHDL: 92.28
Total CHOL/HDL Ratio: 3
Triglycerides: 33 mg/dL (ref 0.0–149.0)
VLDL: 6.6 mg/dL (ref 0.0–40.0)

## 2016-12-08 LAB — PSA: PSA: 3.05 ng/mL (ref 0.10–4.00)

## 2016-12-08 MED ORDER — BENAZEPRIL-HYDROCHLOROTHIAZIDE 20-12.5 MG PO TABS: 1.0000 | ORAL_TABLET | Freq: Every day | ORAL | 6 refills | Status: DC

## 2016-12-08 NOTE — Patient Instructions (Signed)
Limit your sodium (Salt) intake  Please check your blood pressure on a regular basis.  If it is consistently greater than 150/90, please make an office appointment.    It is important that you exercise regularly, at least 20 minutes 3 to 4 times per week.  If you develop chest pain or shortness of breath seek  medical attention.  Return in one year for follow-up  Schedule your colonoscopy to help detect colon cancer. 

## 2016-12-08 NOTE — Progress Notes (Signed)
Subjective:    Patient ID: Jonathan Marks, male    DOB: 1955-09-12, 61 y.o.   MRN: [id number]  HPI  61 year old patient who is seen today for a preventive health examination He has essential hypertension  No concerns or complaints  Past Medical History:  Diagnosis Date  . Prostatitis   . Unspecified essential hypertension 01/21/2007     Social History   Social History  . Marital status: Married    Spouse name: N/A  . Number of children: N/A  . Years of education: N/A   Occupational History  . Not on file.   Social History Main Topics  . Smoking status: Former Research scientist (life sciences)  . Smokeless tobacco: Never Used  . Alcohol use No  . Drug use: Unknown  . Sexual activity: Not on file     Comment: 1 daughter, 2 grandchildren   Other Topics Concern  . Not on file   Social History Narrative  . No narrative on file    Past Surgical History:  Procedure Laterality Date  .  left plantar fasciotomy  October 2012  . COLONOSCOPY     age 35  . HERNIA REPAIR    . INGUINAL HERNIA REPAIR  2001   left , Dr.Weatherly    Family History  Problem Relation Age of Onset  . Hypertension Mother   . Alcohol abuse Father   . Stroke Father   . Alcohol abuse Other   . Hypertension Other   . Stroke Father   . Pancreatic cancer Sister     No Known Allergies  Current Outpatient Prescriptions on File Prior to Visit  Medication Sig Dispense Refill  . benazepril-hydrochlorthiazide (LOTENSIN HCT) 20-12.5 MG tablet Take 1 tablet by mouth daily. 90 tablet 6   No current facility-administered medications on file prior to visit.     BP (!) 148/70 (BP Location: Left Arm, Patient Position: Sitting, Cuff Size: Normal)   Pulse 67   Temp 98 F (36.7 C) (Oral)   Ht 5\' 9"  (1.753 m)   Wt 213 lb 12.8 oz (97 kg)   SpO2 98%   BMI 31.57 kg/m     Review of Systems  Constitutional: Negative for appetite change, chills, fatigue and fever.  HENT: Negative for congestion, dental problem, ear pain,  hearing loss, sore throat, tinnitus, trouble swallowing and voice change.   Eyes: Negative for pain, discharge and visual disturbance.  Respiratory: Negative for cough, chest tightness, wheezing and stridor.   Cardiovascular: Negative for chest pain, palpitations and leg swelling.  Gastrointestinal: Negative for abdominal distention, abdominal pain, blood in stool, constipation, diarrhea, nausea and vomiting.  Genitourinary: Negative for difficulty urinating, discharge, flank pain, genital sores, hematuria and urgency.  Musculoskeletal: Negative for arthralgias, back pain, gait problem, joint swelling, myalgias and neck stiffness.  Skin: Negative for rash.  Neurological: Negative for dizziness, syncope, speech difficulty, weakness, numbness and headaches.  Hematological: Negative for adenopathy. Does not bruise/bleed easily.  Psychiatric/Behavioral: Negative for behavioral problems and dysphoric mood. The patient is not nervous/anxious.        Objective:   Physical Exam  Constitutional: He appears well-developed and well-nourished.  Weight 213 Blood pressure 138/82  HENT:  Head: Normocephalic and atraumatic.  Right Ear: External ear normal.  Left Ear: External ear normal.  Nose: Nose normal.  Mouth/Throat: Oropharynx is clear and moist.  Eyes: Pupils are equal, round, and reactive to light. Conjunctivae and EOM are normal. No scleral icterus.  Neck: Normal range of motion. Neck supple.  No JVD present. No thyromegaly present.  Cardiovascular: Regular rhythm, normal heart sounds and intact distal pulses.  Exam reveals no gallop and no friction rub.   No murmur heard. Pulmonary/Chest: Effort normal and breath sounds normal. He exhibits no tenderness.  Abdominal: Soft. Bowel sounds are normal. He exhibits no distension and no mass. There is no tenderness.  Genitourinary: Prostate normal and penis normal. Rectal exam shows guaiac negative stool.  Musculoskeletal: Normal range of motion. He  exhibits no edema or tenderness.  Bony prominence inferior to the left patella that has been chronic  Lymphadenopathy:    He has no cervical adenopathy.  Neurological: He is alert. He has normal reflexes. No cranial nerve deficit. Coordination normal.  Skin: Skin is warm and dry. No rash noted.  Psychiatric: He has a normal mood and affect. His behavior is normal.          Assessment & Plan:   Preventive health examination.  Will schedule 10 year colonoscopy Check screening lab  Continue home blood pressure monitoring.  Modest weight loss encouraged  Follow-up one year or as needed  [name]

## 2016-12-09 LAB — HEPATITIS C ANTIBODY: HCV AB: NONREACTIVE

## 2016-12-12 ENCOUNTER — Encounter: Payer: Self-pay | Admitting: Gastroenterology

## 2017-01-29 ENCOUNTER — Ambulatory Visit (AMBULATORY_SURGERY_CENTER): Payer: Self-pay

## 2017-01-29 VITALS — Ht 70.0 in | Wt 214.0 lb

## 2017-01-29 DIAGNOSIS — Z1211 Encounter for screening for malignant neoplasm of colon: Secondary | ICD-10-CM

## 2017-01-29 MED ORDER — SUPREP BOWEL PREP KIT 17.5-3.13-1.6 GM/177ML PO SOLN: 1.0000 | Freq: Once | ORAL | 0 refills | Status: AC

## 2017-01-29 NOTE — Progress Notes (Signed)
No allergies to eggs or soy No diet meds No home oxygen No past problems with anesthesia  Declined emmi 

## 2017-01-30 ENCOUNTER — Encounter: Payer: Self-pay | Admitting: Gastroenterology

## 2017-02-11 ENCOUNTER — Telehealth: Payer: Self-pay | Admitting: Pulmonary Disease

## 2017-02-11 NOTE — Telephone Encounter (Signed)
Pt has endoscopy scheduled with Dr. Ardis Hughs with GI and has questions regarding his procedure.  Has never been seen in pulmonary division.  Spoke with pt and told him to call back to answering service and to specify that he needs to speak with GI doctor on call.

## 2017-02-12 ENCOUNTER — Ambulatory Visit (AMBULATORY_SURGERY_CENTER): Payer: 59 | Admitting: Gastroenterology

## 2017-02-12 ENCOUNTER — Encounter: Payer: Self-pay | Admitting: Gastroenterology

## 2017-02-12 VITALS — BP 146/79 | HR 62 | Temp 97.8°F | Resp 16 | Ht 70.0 in | Wt 214.0 lb

## 2017-02-12 DIAGNOSIS — D120 Benign neoplasm of cecum: Secondary | ICD-10-CM | POA: Diagnosis not present

## 2017-02-12 DIAGNOSIS — Z1211 Encounter for screening for malignant neoplasm of colon: Secondary | ICD-10-CM

## 2017-02-12 DIAGNOSIS — D123 Benign neoplasm of transverse colon: Secondary | ICD-10-CM | POA: Diagnosis not present

## 2017-02-12 MED ORDER — SODIUM CHLORIDE 0.9 % IV SOLN: 500.0000 mL | INTRAVENOUS | Status: DC

## 2017-02-12 NOTE — Progress Notes (Signed)
Pt's states no medical or surgical changes since previsit or office visit. maw 

## 2017-02-12 NOTE — Progress Notes (Signed)
Called to room to assist during endoscopic procedure.  Patient ID and intended procedure confirmed with present staff. Received instructions for my participation in the procedure from the performing physician.  

## 2017-02-12 NOTE — Progress Notes (Signed)
Report to PACU, RN, vss, BBS= Clear.  

## 2017-02-12 NOTE — Op Note (Signed)
[facility] Patient Name: Jonathan Marks Procedure Date: 02/12/2017 8:20 AM MRN: [id number] Endoscopist: Milus Banister , MD Age: 61 Referring MD:  Date of Birth: [birth year] Gender: Male Account #: [id number] Procedure:                Colonoscopy Indications:              Screening for colorectal malignant neoplasm;                            colonoscopy 2007 no polyps Medicines:                Monitored Anesthesia Care Procedure:                Pre-Anesthesia Assessment:                           - Prior to the procedure, a History and Physical                            was performed, and patient medications and                            allergies were reviewed. The patient's tolerance of                            previous anesthesia was also reviewed. The risks                            and benefits of the procedure and the sedation                            options and risks were discussed with the patient.                            All questions were answered, and informed consent                            was obtained. Prior Anticoagulants: The patient has                            taken no previous anticoagulant or antiplatelet                            agents. ASA Grade Assessment: II - A patient with                            mild systemic disease. After reviewing the risks                            and benefits, the patient was deemed in                            satisfactory condition to undergo the procedure.  After obtaining informed consent, the colonoscope                            was passed under direct vision. Throughout the                            procedure, the patient's blood pressure, pulse, and                            oxygen saturations were monitored continuously. The                            Colonoscope was introduced through the anus and                            advanced to the the cecum, identified by                             appendiceal orifice and ileocecal valve. The                            colonoscopy was performed without difficulty. The                            patient tolerated the procedure well. The quality                            of the bowel preparation was good. The ileocecal                            valve, appendiceal orifice, and rectum were                            photographed. Scope In: 8:23:44 AM Scope Out: 8:36:21 AM Scope Withdrawal Time: 0 hours 10 minutes 19 seconds  Total Procedure Duration: 0 hours 12 minutes 37 seconds  Findings:                 Two sessile polyps were found in the transverse                            colon and cecum. The polyps were 2 to 3 mm in size.                            These polyps were removed with a cold snare.                            Resection and retrieval were complete.                           The exam was otherwise without abnormality on                            direct and retroflexion views. Complications:  No immediate complications. Estimated blood loss:                            None. Estimated Blood Loss:     Estimated blood loss: none. Impression:               - Two 2 to 3 mm polyps in the transverse colon and                            in the cecum, removed with a cold snare. Resected                            and retrieved.                           - The examination was otherwise normal on direct                            and retroflexion views. Recommendation:           - Patient has a contact number available for                            emergencies. The signs and symptoms of potential                            delayed complications were discussed with the                            patient. Return to normal activities tomorrow.                            Written discharge instructions were provided to the                            patient.                           - Resume  previous diet.                           - Continue present medications.                           You will receive a letter within 2-3 weeks with the                            pathology results and my final recommendations.                           If the polyp(s) is proven to be 'pre-cancerous' on                            pathology, you will need repeat colonoscopy in 5  years. If the polyp(s) is NOT 'precancerous' on                            pathology then you should repeat colon cancer                            screening in 10 years with colonoscopy without need                            for colon cancer screening by any method prior to                            then (including stool testing). Milus Banister, MD 02/12/2017 8:38:06 AM This report has been signed electronically.

## 2017-02-12 NOTE — Patient Instructions (Signed)
**  Handouts given on polyps **   YOU HAD AN ENDOSCOPIC PROCEDURE TODAY AT THE [facility]:   Refer to the procedure report that was given to you for any specific questions about what was found during the examination.  If the procedure report does not answer your questions, please call your gastroenterologist to clarify.  If you requested that your care partner not be given the details of your procedure findings, then the procedure report has been included in a sealed envelope for you to review at your convenience later.  YOU SHOULD EXPECT: Some feelings of bloating in the abdomen. Passage of more gas than usual.  Walking can help get rid of the air that was put into your GI tract during the procedure and reduce the bloating. If you had a lower endoscopy (such as a colonoscopy or flexible sigmoidoscopy) you may notice spotting of blood in your stool or on the toilet paper. If you underwent a bowel prep for your procedure, you may not have a normal bowel movement for a few days.  Please Note:  You might notice some irritation and congestion in your nose or some drainage.  This is from the oxygen used during your procedure.  There is no need for concern and it should clear up in a day or so.  SYMPTOMS TO REPORT IMMEDIATELY:   Following lower endoscopy (colonoscopy or flexible sigmoidoscopy):  Excessive amounts of blood in the stool  Significant tenderness or worsening of abdominal pains  Swelling of the abdomen that is new, acute  Fever of 100F or higher  For urgent or emergent issues, a gastroenterologist can be reached at any hour by calling [phone].   DIET:  We do recommend a small meal at first, but then you may proceed to your regular diet.  Drink plenty of fluids but you should avoid alcoholic beverages for 24 hours.  ACTIVITY:  You should plan to take it easy for the rest of today and you should NOT DRIVE or use heavy machinery until tomorrow (because of the sedation  medicines used during the test).    FOLLOW UP: Our staff will call the number listed on your records the next business day following your procedure to check on you and address any questions or concerns that you may have regarding the information given to you following your procedure. If we do not reach you, we will leave a message.  However, if you are feeling well and you are not experiencing any problems, there is no need to return our call.  We will assume that you have returned to your regular daily activities without incident.  If any biopsies were taken you will be contacted by phone or by letter within the next 1-3 weeks.  Please call us at [phone] if you have not heard about the biopsies in 3 weeks.    SIGNATURES/CONFIDENTIALITY: You and/or your care partner have signed paperwork which will be entered into your electronic medical record.  These signatures attest to the fact that that the information above on your After Visit Summary has been reviewed and is understood.  Full responsibility of the confidentiality of this discharge information lies with you and/or your care-partner. 

## 2017-02-13 ENCOUNTER — Telehealth: Payer: Self-pay | Admitting: *Deleted

## 2017-02-13 NOTE — Telephone Encounter (Deleted)
  Follow up Call-  Call back number 02/12/2017  Post procedure Call Back phone  # [phone] [facility] - wife  Permission to leave phone message Yes  Some recent data might be hidden     Patient questions:  Do you have a fever, pain , or abdominal swelling? No. Pain Score  0 *  Have you tolerated food without any problems? Yes.    Have you been able to return to your normal activities? Yes.    Do you have any questions about your discharge instructions: Diet   No. Medications  No. Follow up visit  No.  Do you have questions or concerns about your Care? No.  Actions: * If pain score is 4 or above: No action needed, pain <4.

## 2017-02-13 NOTE — Telephone Encounter (Signed)
No answer for post procedure call back. Will attempt to call back later. SM

## 2017-02-13 NOTE — Telephone Encounter (Signed)
No answer for second post procedure call back left message for patient to call if any questions or concerns. SM

## 2017-02-15 ENCOUNTER — Encounter: Payer: Self-pay | Admitting: Gastroenterology

## 2017-03-03 ENCOUNTER — Ambulatory Visit (INDEPENDENT_AMBULATORY_CARE_PROVIDER_SITE_OTHER): Payer: Managed Care, Other (non HMO)

## 2017-03-03 ENCOUNTER — Ambulatory Visit (INDEPENDENT_AMBULATORY_CARE_PROVIDER_SITE_OTHER): Payer: Managed Care, Other (non HMO) | Admitting: Osteopathic Medicine

## 2017-03-03 ENCOUNTER — Encounter: Payer: Self-pay | Admitting: Osteopathic Medicine

## 2017-03-03 VITALS — BP 138/82 | HR 65 | Temp 98.0°F | Resp 16 | Ht 70.08 in | Wt 217.0 lb

## 2017-03-03 DIAGNOSIS — R0981 Nasal congestion: Secondary | ICD-10-CM | POA: Diagnosis not present

## 2017-03-03 DIAGNOSIS — R059 Cough, unspecified: Secondary | ICD-10-CM

## 2017-03-03 DIAGNOSIS — R05 Cough: Secondary | ICD-10-CM

## 2017-03-03 DIAGNOSIS — R058 Other specified cough: Secondary | ICD-10-CM

## 2017-03-03 MED ORDER — IPRATROPIUM BROMIDE 0.06 % NA SOLN: 2.0000 | Freq: Four times a day (QID) | NASAL | 1 refills | Status: DC

## 2017-03-03 MED ORDER — AZITHROMYCIN 250 MG PO TABS: ORAL_TABLET | ORAL | 0 refills | Status: DC

## 2017-03-03 MED ORDER — PREDNISONE 20 MG PO TABS: 20.0000 mg | ORAL_TABLET | Freq: Two times a day (BID) | ORAL | 0 refills | Status: DC

## 2017-03-03 MED ORDER — BENZONATATE 200 MG PO CAPS: 200.0000 mg | ORAL_CAPSULE | Freq: Three times a day (TID) | ORAL | 1 refills | Status: DC | PRN

## 2017-03-03 NOTE — Progress Notes (Signed)
HPI: Jonathan Marks is a 61 y.o. male with has a past medical history of Prostatitis and Unspecified essential hypertension (01/21/2007).  who presents to Primary Care at [facility] today, 03/03/17,  for chief complaint of:  Chief Complaint  Patient presents with  . Cough    with some shob   . Nasal Congestion    with some chest congestion x 1 week     Last week sign with sinus congestion, sore throat, runny nose, cough. OTC treatment helped a bit, now has lingering cough and runny nose, no focal sinus pain, no fever    Past medical, surgical, social and family history reviewed:  Patient Active Problem List   Diagnosis Date Noted  . Health maintenance examination 06/10/2010  . Essential hypertension 01/21/2007    Past Surgical History:  Procedure Laterality Date  .  left plantar fasciotomy  October 2012  . COLONOSCOPY     age 56  . HERNIA REPAIR    . INGUINAL HERNIA REPAIR  2001   left , Dr.Weatherly  . WISDOM TOOTH EXTRACTION     Social History  Substance Use Topics  . Smoking status: Former Research scientist (life sciences)  . Smokeless tobacco: Never Used  . Alcohol use No   Family History  Problem Relation Age of Onset  . Hypertension Mother   . Alcohol abuse Father   . Stroke Father   . Pancreatic cancer Sister   . Alcohol abuse Other   . Hypertension Other   . Colon cancer Neg Hx   . Esophageal cancer Neg Hx   . Prostate cancer Neg Hx   . Rectal cancer Neg Hx   . Stomach cancer Neg Hx      Current medication list and allergy/intolerance information reviewed:    Current Outpatient Prescriptions  Medication Sig Dispense Refill  . benazepril-hydrochlorthiazide (LOTENSIN HCT) 20-12.5 MG tablet Take 1 tablet by mouth daily. 90 tablet 6   No current facility-administered medications for this visit.     No Known Allergies    Review of Systems:  Constitutional:  No  fever, no chills, +recent illness, No unintentional weight changes. No significant fatigue.   HEENT: No  headache,  no vision change, no hearing change, No sore throat, No  sinus pressure, +sinus drainage  Cardiac: No  chest pain, No  pressure, No palpitations  Respiratory:  No  shortness of breath. +Cough  Gastrointestinal: No  abdominal pain, No  nausea, No  vomiting,  No  blood in stool, No  diarrhea,  Musculoskeletal: No new myalgia/arthralgia  Skin: No  Rash,  Neurologic: No  weakness, No  dizziness  Exam:  BP 138/82   Pulse 65   Temp 98 F (36.7 C) (Oral)   Resp 16   Ht 5' 10.08" (1.78 m)   Wt 217 lb (98.4 kg)   SpO2 97%   BMI 31.07 kg/m   Constitutional: VS see above. General Appearance: alert, well-developed, well-nourished, NAD  Eyes: Normal lids and conjunctive, non-icteric sclera  Ears, Nose, Mouth, Throat: MMM, Normal external inspection ears/nares/mouth/lips/gums. TM normal bilaterally. Pharynx/tonsils no erythema, no exudate. Nasal mucosa normal.   Neck: No masses, trachea midline. No thyroid enlargement. No tenderness/mass appreciated. No lymphadenopathy  Respiratory: Normal respiratory effort. no wheeze, no rhonchi, no rales  Cardiovascular: S1/S2 normal, +faint systolic murmur, no rub/gallop auscultated. RRR. No lower extremity edema.   Musculoskeletal: Gait normal.   Skin: warm, dry, intact. No rash/ulcer.   Psychiatric: Normal judgment/insight. Normal mood and affect. Oriented x3.  No results found for this or any previous visit (from the past 72 hour(s)).  Dg Chest 2 View  Result Date: 03/03/2017 CLINICAL DATA:  Persistent cough for 2 weeks. EXAM: CHEST  2 VIEW COMPARISON:  06/21/2016 FINDINGS: The heart size and mediastinal contours are within normal limits. Both lungs are clear. The visualized skeletal structures are unremarkable. IMPRESSION: No active cardiopulmonary disease. Electronically Signed   By: Nolon Nations M.D.   On: 03/03/2017 10:56    ASSESSMENT/PLAN:   Cough - Plan: DG Chest 2 View, azithromycin (ZITHROMAX) 250 MG tablet  Sinus  congestion - Plan: azithromycin (ZITHROMAX) 250 MG tablet  Post-viral cough syndrome - Plan: benzonatate (TESSALON) 200 MG capsule, ipratropium (ATROVENT) 0.06 % nasal spray, predniSONE (DELTASONE) 20 MG tablet    Patient Instructions    Xray was normal, no pneumonia or bronchitis needing urgent antibiotic treatment.   I think we are looking at a post-viral cough syndrome, this almost always gets better with time and we help it along with a steroid treatment and other symptom treatment.   I've printed antibiotics in case these aren't working but please do not fill the antibiotics unless absolutely needed (not getting better after 2-3 days on steroids.cough medicine, getting worse or fever, worsening sinus congestion)    IF you received an x-ray today, you will receive an invoice from [facility]. Please contact [facility] at [phone] with questions or concerns regarding your invoice.   IF you received labwork today, you will receive an invoice from [facility]. Please contact [facility] at [phone] with questions or concerns regarding your invoice.   Our billing staff will not be able to assist you with questions regarding bills from these companies.  You will be contacted with the lab results as soon as they are available. The fastest way to get your results is to activate your My Chart account. Instructions are located on the last page of this paperwork. If you have not heard from [place] regarding the results in 2 weeks, please contact this office.         Visit summary with medication list and pertinent instructions was printed for patient to review. All questions at time of visit were answered - patient instructed to contact office with any additional concerns. ER/RTC precautions were reviewed with the patient. Follow-up plan: Return if symptoms worsen or fail to improve.  Note: Total time spent 25 minutes, greater than 50% of the visit was spent face-to-face  counseling and coordinating care for the following: The primary encounter diagnosis was Cough. Diagnoses of Sinus congestion and Post-viral cough syndrome were also pertinent to this visit.Marland Kitchen  Please note: voice recognition software was used to produce this document, and typos may escape review. Please contact me for any needed clarifications.

## 2017-03-03 NOTE — Patient Instructions (Addendum)
   Xray was normal, no pneumonia or bronchitis needing urgent antibiotic treatment.   I think we are looking at a post-viral cough syndrome, this almost always gets better with time and we help it along with a steroid treatment and other symptom treatment.   I've printed antibiotics in case these aren't working but please do not fill the antibiotics unless absolutely needed (not getting better after 2-3 days on steroids.cough medicine, getting worse or fever, worsening sinus congestion)    IF you received an x-ray today, you will receive an invoice from [facility]. Please contact [facility] at [phone] with questions or concerns regarding your invoice.   IF you received labwork today, you will receive an invoice from [facility]. Please contact [facility] at [phone] with questions or concerns regarding your invoice.   Our billing staff will not be able to assist you with questions regarding bills from these companies.  You will be contacted with the lab results as soon as they are available. The fastest way to get your results is to activate your My Chart account. Instructions are located on the last page of this paperwork. If you have not heard from [place] regarding the results in 2 weeks, please contact this office.

## 2017-04-04 ENCOUNTER — Ambulatory Visit: Payer: Self-pay

## 2017-04-04 ENCOUNTER — Other Ambulatory Visit: Payer: Self-pay | Admitting: Occupational Medicine

## 2017-04-04 DIAGNOSIS — Z0000 Encounter for general adult medical examination without abnormal findings: Secondary | ICD-10-CM

## 2017-07-16 ENCOUNTER — Ambulatory Visit: Payer: Self-pay | Admitting: *Deleted

## 2017-07-16 NOTE — Telephone Encounter (Signed)
Would like to know if you want to work this patient in this week somewhere? Pt only wants to see Dr. Raliegh Ip

## 2017-07-16 NOTE — Telephone Encounter (Signed)
Pt called with several c/o's the states the most worrisome is pain and swelling in his right knee. He states he walks on concrete so he thinks that that is the problem. He has taken Advil for his discomfort but usually does not take anything. He feels like there is fluid in that knee. Per protocol pt needs an appointment in 3 days. He requested that he sees only Dr. Burnice Logan. Appointment made for next week per pt request.   He also states that has pain in his sides and some chest tightness at times. He gets this pain when he is pulling up something at work.   Denies any cardiac symptoms, no n/v, sweating,dizziness, weakness or numbness. States dull h/a at times.   Flow contacted at the office regarding his appointment for next week.  Reason for Disposition . Fluid-filled sack just below knee cap  (i.e., inferior aspect of the anterior knee)  Answer Assessment - Initial Assessment Questions 1. LOCATION: "Where is the swelling located?"  (e.g., left, right, both knees)     Right knee 2. SIZE and DESCRIPTION: "What does the swelling look like?"  (e.g., entire knee, localized)     Left inner part of the knee 3. ONSET: "When did the swelling start?" "Does it come and go, or is it there all the time?"     Last Thursday or Friday,  Comes and goes 4. PAIN: "Is there any pain?" If so, ask: "How bad is it?" (Scale 1-10; or mild, moderate, severe)     Yes, # 3 or 4 5. SETTING: "Has there been any recent work, exercise or other activity that involved that part of the body?"      Walking while working  6. AGGRAVATING FACTORS: "What makes the knee swelling worse?" (e.g., walking, climbing stairs, running)     Standing on concrete 7. ASSOCIATED SYMPTOMS: "Is there any pain or redness?"     Yes pain 8. OTHER SYMPTOMS: "Do you have any other symptoms?" (e.g., chest pain, difficulty breathing, fever, calf pain)     Chest tightness every now and then per patient  9. PREGNANCY: "Is there any chance you  are pregnant?" "When was your last menstrual period?"     n/a  Protocols used: KNEE [facility]

## 2017-07-18 ENCOUNTER — Ambulatory Visit: Payer: Self-pay | Admitting: Internal Medicine

## 2017-07-18 DIAGNOSIS — Z0289 Encounter for other administrative examinations: Secondary | ICD-10-CM

## 2017-07-24 ENCOUNTER — Ambulatory Visit (INDEPENDENT_AMBULATORY_CARE_PROVIDER_SITE_OTHER): Payer: 59 | Admitting: Internal Medicine

## 2017-07-24 ENCOUNTER — Encounter: Payer: Self-pay | Admitting: Internal Medicine

## 2017-07-24 VITALS — BP 130/80 | HR 75 | Temp 98.6°F | Wt 221.0 lb

## 2017-07-24 DIAGNOSIS — I10 Essential (primary) hypertension: Secondary | ICD-10-CM

## 2017-07-24 DIAGNOSIS — M25561 Pain in right knee: Secondary | ICD-10-CM

## 2017-07-24 NOTE — Patient Instructions (Signed)
Aleve 1 tablet twice daily as needed for knee pain  You  may move around, but avoid painful motions and activities.  Please call for orthopedic referral if right knee pain intensifies  Return as scheduled in August for your annual exam

## 2017-07-24 NOTE — Progress Notes (Signed)
Subjective:    Patient ID: Jonathan Marks, male    DOB: 09/02/1955, 62 y.o.   MRN: [id number]  HPI 62 year old patient who has a history of essential hypertension.  His chief complaint today is intermittent right knee pain.  At times he has pain especially when walking down stairs.  He is on his feet most of his work day on hard surfaces.  No recent or remote trauma.  He feels that at times there is some soft tissue swelling involving the right knee  Past Medical History:  Diagnosis Date  . Prostatitis   . Unspecified essential hypertension 01/21/2007     Social History   Socioeconomic History  . Marital status: Married    Spouse name: Not on file  . Number of children: Not on file  . Years of education: Not on file  . Highest education level: Not on file  Social Needs  . Financial resource strain: Not on file  . Food insecurity - worry: Not on file  . Food insecurity - inability: Not on file  . Transportation needs - medical: Not on file  . Transportation needs - non-medical: Not on file  Occupational History  . Not on file  Tobacco Use  . Smoking status: Former Research scientist (life sciences)  . Smokeless tobacco: Never Used  Substance and Sexual Activity  . Alcohol use: No  . Drug use: No  . Sexual activity: Not on file    Comment: 1 daughter, 2 grandchildren  Other Topics Concern  . Not on file  Social History Narrative  . Not on file    Past Surgical History:  Procedure Laterality Date  .  left plantar fasciotomy  October 2012  . COLONOSCOPY     age 89  . HERNIA REPAIR    . INGUINAL HERNIA REPAIR  2001   left , Dr.Weatherly  . WISDOM TOOTH EXTRACTION      Family History  Problem Relation Age of Onset  . Hypertension Mother   . Alcohol abuse Father   . Stroke Father   . Pancreatic cancer Sister   . Alcohol abuse Other   . Hypertension Other   . Colon cancer Neg Hx   . Esophageal cancer Neg Hx   . Prostate cancer Neg Hx   . Rectal cancer Neg Hx   . Stomach cancer Neg Hx       No Known Allergies  Current Outpatient Medications on File Prior to Visit  Medication Sig Dispense Refill  . azithromycin (ZITHROMAX) 250 MG tablet 2 tabs po on Day 1, then 1 tab daily Days 2 - 5. Fill if fever or worsening cough, or if no relief with 2 days of steroids/nasal spray/cough meds 6 tablet 0  . benazepril-hydrochlorthiazide (LOTENSIN HCT) 20-12.5 MG tablet Take 1 tablet by mouth daily. 90 tablet 6  . benzonatate (TESSALON) 200 MG capsule Take 1 capsule (200 mg total) by mouth 3 (three) times daily as needed for cough. 30 capsule 1  . ipratropium (ATROVENT) 0.06 % nasal spray Place 2 sprays into both nostrils 4 (four) times daily. As needed for sinus congestion 15 mL 1  . predniSONE (DELTASONE) 20 MG tablet Take 1 tablet (20 mg total) by mouth 2 (two) times daily with a meal. 10 tablet 0   No current facility-administered medications on file prior to visit.     BP 130/80   Pulse 75   Temp 98.6 F (37 C) (Oral)   Wt 221 lb (100.2 kg)   SpO2  95%   BMI 31.64 kg/m      Review of Systems  Cardiovascular: Positive for chest pain.  Musculoskeletal: Positive for joint swelling.  Neurological: Positive for headaches.       Objective:   Physical Exam  Constitutional: He appears well-developed and well-nourished. No distress.  Musculoskeletal:  Examination right knee revealed no active inflammatory changes mild tenderness along the medial joint line.  Full range of motion          Assessment & Plan:   Intermittent right knee pain.  Proper foot care discussed.  Patient will try naproxen as needed.  If symptoms intensify or interfere with work or leisure time has been asked to call the office for orthopedic referral Essential hypertension stable  Annual CPX as scheduled  [name]

## 2017-12-10 ENCOUNTER — Ambulatory Visit (INDEPENDENT_AMBULATORY_CARE_PROVIDER_SITE_OTHER): Payer: 59 | Admitting: Internal Medicine

## 2017-12-10 ENCOUNTER — Encounter: Payer: Self-pay | Admitting: Internal Medicine

## 2017-12-10 VITALS — BP 124/70 | HR 72 | Temp 98.3°F | Wt 220.0 lb

## 2017-12-10 DIAGNOSIS — I10 Essential (primary) hypertension: Secondary | ICD-10-CM | POA: Diagnosis not present

## 2017-12-10 DIAGNOSIS — Z0000 Encounter for general adult medical examination without abnormal findings: Secondary | ICD-10-CM

## 2017-12-10 LAB — CBC WITH DIFFERENTIAL/PLATELET
Basophils Absolute: 0.1 10*3/uL (ref 0.0–0.1)
Basophils Relative: 0.9 % (ref 0.0–3.0)
EOS PCT: 6.8 % — AB (ref 0.0–5.0)
Eosinophils Absolute: 0.4 10*3/uL (ref 0.0–0.7)
HCT: 45 % (ref 39.0–52.0)
Hemoglobin: 15.9 g/dL (ref 13.0–17.0)
LYMPHS ABS: 1.8 10*3/uL (ref 0.7–4.0)
Lymphocytes Relative: 29.6 % (ref 12.0–46.0)
MCHC: 35.4 g/dL (ref 30.0–36.0)
MCV: 87 fl (ref 78.0–100.0)
MONO ABS: 0.8 10*3/uL (ref 0.1–1.0)
Monocytes Relative: 13 % — ABNORMAL HIGH (ref 3.0–12.0)
NEUTROS ABS: 3 10*3/uL (ref 1.4–7.7)
NEUTROS PCT: 49.7 % (ref 43.0–77.0)
PLATELETS: 214 10*3/uL (ref 150.0–400.0)
RBC: 5.17 Mil/uL (ref 4.22–5.81)
RDW: 13.9 % (ref 11.5–15.5)
WBC: 6 10*3/uL (ref 4.0–10.5)

## 2017-12-10 LAB — COMPREHENSIVE METABOLIC PANEL
ALT: 23 U/L (ref 0–53)
AST: 14 U/L (ref 0–37)
Albumin: 4.1 g/dL (ref 3.5–5.2)
Alkaline Phosphatase: 44 U/L (ref 39–117)
BUN: 16 mg/dL (ref 6–23)
CO2: 31 meq/L (ref 19–32)
Calcium: 9.4 mg/dL (ref 8.4–10.5)
Chloride: 105 mEq/L (ref 96–112)
Creatinine, Ser: 1.2 mg/dL (ref 0.40–1.50)
GFR: 79.22 mL/min (ref >60.00)
GLUCOSE: 104 mg/dL — AB (ref 70–99)
POTASSIUM: 4.1 meq/L (ref 3.5–5.1)
SODIUM: 142 meq/L (ref 135–145)
TOTAL PROTEIN: 6.4 g/dL (ref 6.0–8.3)
Total Bilirubin: 0.7 mg/dL (ref 0.2–1.2)

## 2017-12-10 LAB — LIPID PANEL
CHOL/HDL RATIO: 3
Cholesterol: 150 mg/dL (ref 0–200)
HDL: 52.8 mg/dL (ref >39.00)
LDL Cholesterol: 88 mg/dL (ref 0–99)
NONHDL: 97.65
Triglycerides: 46 mg/dL (ref 0.0–149.0)
VLDL: 9.2 mg/dL (ref 0.0–40.0)

## 2017-12-10 LAB — TSH: TSH: 0.85 u[IU]/mL (ref 0.35–4.50)

## 2017-12-10 LAB — PSA: PSA: 3.06 ng/mL (ref 0.10–4.00)

## 2017-12-10 MED ORDER — BENAZEPRIL-HYDROCHLOROTHIAZIDE 20-12.5 MG PO TABS: 1.0000 | ORAL_TABLET | Freq: Every day | ORAL | 6 refills | Status: DC

## 2017-12-10 NOTE — Progress Notes (Signed)
Subjective:    Patient ID: Jonathan Marks, male    DOB: 1956/05/05, 62 y.o.   MRN: [id number]  HPI  61 year old patient who is seen today for a preventive health examination He has a history of essential hypertension and otherwise does quite well. Patient had follow-up colonoscopy in October of last year  He was seen in the spring with some right knee pain that has resolved.  No other concerns or complaints  Past Medical History:  Diagnosis Date  . Prostatitis   . Unspecified essential hypertension 01/21/2007     Social History   Socioeconomic History  . Marital status: Married    Spouse name: Not on file  . Number of children: Not on file  . Years of education: Not on file  . Highest education level: Not on file  Occupational History  . Not on file  Social Needs  . Financial resource strain: Not on file  . Food insecurity:    Worry: Not on file    Inability: Not on file  . Transportation needs:    Medical: Not on file    Non-medical: Not on file  Tobacco Use  . Smoking status: Former Research scientist (life sciences)  . Smokeless tobacco: Never Used  Substance and Sexual Activity  . Alcohol use: No  . Drug use: No  . Sexual activity: Not on file    Comment: 1 daughter, 2 grandchildren  Lifestyle  . Physical activity:    Days per week: Not on file    Minutes per session: Not on file  . Stress: Not on file  Relationships  . Social connections:    Talks on phone: Not on file    Gets together: Not on file    Attends religious service: Not on file    Active member of club or organization: Not on file    Attends meetings of clubs or organizations: Not on file    Relationship status: Not on file  . Intimate partner violence:    Fear of current or ex partner: Not on file    Emotionally abused: Not on file    Physically abused: Not on file    Forced sexual activity: Not on file  Other Topics Concern  . Not on file  Social History Narrative  . Not on file    Past Surgical History:    Procedure Laterality Date  .  left plantar fasciotomy  October 2012  . COLONOSCOPY     age 31  . HERNIA REPAIR    . INGUINAL HERNIA REPAIR  2001   left , Dr.Weatherly  . WISDOM TOOTH EXTRACTION      Family History  Problem Relation Age of Onset  . Hypertension Mother   . Alcohol abuse Father   . Stroke Father   . Pancreatic cancer Sister   . Alcohol abuse Other   . Hypertension Other   . Colon cancer Neg Hx   . Esophageal cancer Neg Hx   . Prostate cancer Neg Hx   . Rectal cancer Neg Hx   . Stomach cancer Neg Hx     No Known Allergies  Current Outpatient Medications on File Prior to Visit  Medication Sig Dispense Refill  . azithromycin (ZITHROMAX) 250 MG tablet 2 tabs po on Day 1, then 1 tab daily Days 2 - 5. Fill if fever or worsening cough, or if no relief with 2 days of steroids/nasal spray/cough meds 6 tablet 0  . benazepril-hydrochlorthiazide (LOTENSIN HCT) 20-12.5 MG tablet Take  1 tablet by mouth daily. 90 tablet 6  . benzonatate (TESSALON) 200 MG capsule Take 1 capsule (200 mg total) by mouth 3 (three) times daily as needed for cough. 30 capsule 1  . ipratropium (ATROVENT) 0.06 % nasal spray Place 2 sprays into both nostrils 4 (four) times daily. As needed for sinus congestion 15 mL 1  . predniSONE (DELTASONE) 20 MG tablet Take 1 tablet (20 mg total) by mouth 2 (two) times daily with a meal. 10 tablet 0   No current facility-administered medications on file prior to visit.     BP 124/70 (BP Location: Right Arm, Patient Position: Sitting, Cuff Size: Large)   Pulse 72   Temp 98.3 F (36.8 C) (Oral)   Wt 220 lb (99.8 kg)   SpO2 97%   BMI 31.50 kg/m      Review of Systems  Constitutional: Negative for appetite change, chills, fatigue and fever.  HENT: Negative for congestion, dental problem, ear pain, hearing loss, sore throat, tinnitus, trouble swallowing and voice change.   Eyes: Negative for pain, discharge and visual disturbance.  Respiratory: Negative  for cough, chest tightness, wheezing and stridor.   Cardiovascular: Negative for chest pain, palpitations and leg swelling.  Gastrointestinal: Negative for abdominal distention, abdominal pain, blood in stool, constipation, diarrhea, nausea and vomiting.  Genitourinary: Negative for difficulty urinating, discharge, flank pain, genital sores, hematuria and urgency.  Musculoskeletal: Negative for arthralgias, back pain, gait problem, joint swelling, myalgias and neck stiffness.  Skin: Negative for rash.  Neurological: Negative for dizziness, syncope, speech difficulty, weakness, numbness and headaches.  Hematological: Negative for adenopathy. Does not bruise/bleed easily.  Psychiatric/Behavioral: Negative for behavioral problems and dysphoric mood. The patient is not nervous/anxious.        Objective:   Physical Exam  Constitutional: He appears well-developed and well-nourished.  HENT:  Head: Normocephalic and atraumatic.  Right Ear: External ear normal.  Left Ear: External ear normal.  Nose: Nose normal.  Mouth/Throat: Oropharynx is clear and moist.  Eyes: Pupils are equal, round, and reactive to light. Conjunctivae and EOM are normal. No scleral icterus.  Neck: Normal range of motion. Neck supple. No JVD present. No thyromegaly present.  Cardiovascular: Regular rhythm and intact distal pulses. Exam reveals no gallop and no friction rub.  Murmur heard. Grade 2/6 systolic murmur loudest at the apex  Pulmonary/Chest: Effort normal and breath sounds normal. He exhibits no tenderness.  Abdominal: Soft. Bowel sounds are normal. He exhibits no distension and no mass. There is no tenderness.  Genitourinary: Prostate normal and penis normal.  Musculoskeletal: Normal range of motion. He exhibits no edema or tenderness.  Lymphadenopathy:    He has no cervical adenopathy.  Neurological: He is alert. He has normal reflexes. No cranial nerve deficit. Coordination normal.  Skin: Skin is warm and  dry. No rash noted.  Psychiatric: He has a normal mood and affect. His behavior is normal.          Assessment & Plan:   Preventive health examination.  Will check updated lab Essential hypertension well-controlled  No change in medical regimen Home blood pressure monitoring encouraged Low-salt diet recommended  Follow-up 6 to 12 months  Marletta Lor

## 2017-12-10 NOTE — Patient Instructions (Signed)
Limit your sodium (Salt) intake  Please check your blood pressure on a regular basis.  If it is consistently greater than 140/90, please make an office appointment.  Return in one year for follow-up

## 2018-03-24 ENCOUNTER — Telehealth (HOSPITAL_COMMUNITY): Payer: Self-pay | Admitting: Emergency Medicine

## 2018-03-24 ENCOUNTER — Ambulatory Visit (HOSPITAL_COMMUNITY)
Admission: EM | Admit: 2018-03-24 | Discharge: 2018-03-24 | Disposition: A | Discharge status: Home / Self Care | Payer: 59 | Attending: Family Medicine | Admitting: Family Medicine

## 2018-03-24 ENCOUNTER — Ambulatory Visit (INDEPENDENT_AMBULATORY_CARE_PROVIDER_SITE_OTHER): Payer: 59

## 2018-03-24 ENCOUNTER — Encounter (HOSPITAL_COMMUNITY): Payer: Self-pay | Admitting: Emergency Medicine

## 2018-03-24 ENCOUNTER — Other Ambulatory Visit: Payer: Self-pay

## 2018-03-24 DIAGNOSIS — M79605 Pain in left leg: Secondary | ICD-10-CM | POA: Diagnosis not present

## 2018-03-24 DIAGNOSIS — M7731 Calcaneal spur, right foot: Secondary | ICD-10-CM | POA: Diagnosis not present

## 2018-03-24 DIAGNOSIS — M79671 Pain in right foot: Secondary | ICD-10-CM

## 2018-03-24 MED ORDER — DICLOFENAC SODIUM 75 MG PO TBEC: 75.0000 mg | DELAYED_RELEASE_TABLET | Freq: Two times a day (BID) | ORAL | 0 refills | Status: DC | PRN

## 2018-03-24 NOTE — Telephone Encounter (Signed)
Patient requested that his medications be resent to the 24 hour pharmacy.

## 2018-03-24 NOTE — Discharge Instructions (Addendum)
Recommend take Diclofenac 75mg  every 12 hours as needed for leg and foot pain. May apply warm compresses to leg as needed for comfort. Wear ace wrap for support. Follow-up with the Podiatrist (Foot doctor) for right heel pain and follow-up with your PCP if left leg pain does not improve within 3 to 4 days.

## 2018-03-24 NOTE — ED Provider Notes (Signed)
[facility]    CSN: [id number] Arrival date & time: 03/24/18  1454     History   Chief Complaint Chief Complaint  Patient presents with  . Foot Pain    right  . Leg Pain    left posterior upper    HPI Jonathan Marks is a 62 y.o. male.   62 year old male presents with 2 concerns. 1st is right heel pain that has been occurring for over 1 week. Pain occurs mostly with stepping and pressure. No distinct injury or trauma to area. Has not taken any medication for pain or applied any topical therapies.  2nd concern is he was going up steps today when he thought he might drop what he was carrying and moved his entire body and twisted his left leg. He felt a "pop" in the back of his thigh and now his having pain from just above his knee to mid to upper posterior thigh area. He has not applied any ice, heat or taken any medication for pain. He is having difficulty walking and putting pressure on his left leg. Only chronic health issue is HTN and currently on Lotensin HCTZ daily.   The history is provided by the patient and the spouse.    Past Medical History:  Diagnosis Date  . Prostatitis   . Unspecified essential hypertension 01/21/2007    Patient Active Problem List   Diagnosis Date Noted  . Health maintenance examination 06/10/2010  . Essential hypertension 01/21/2007    Past Surgical History:  Procedure Laterality Date  .  left plantar fasciotomy  October 2012  . COLONOSCOPY     age 39  . HERNIA REPAIR    . INGUINAL HERNIA REPAIR  2001   left , Dr.Weatherly  . WISDOM TOOTH EXTRACTION         Home Medications    Prior to Admission medications   Medication Sig Start Date End Date Taking? Authorizing Provider  benazepril-hydrochlorthiazide (LOTENSIN HCT) 20-12.5 MG tablet Take 1 tablet by mouth daily. 12/10/17  Yes Marletta Lor, MD  diclofenac (VOLTAREN) 75 MG EC tablet Take 1 tablet (75 mg total) by mouth 2 (two) times daily as needed for moderate  pain. 03/24/18   Katy Apo, NP    Family History Family History  Problem Relation Age of Onset  . Hypertension Mother   . Alcohol abuse Father   . Stroke Father   . Pancreatic cancer Sister   . Alcohol abuse Other   . Hypertension Other   . Colon cancer Neg Hx   . Esophageal cancer Neg Hx   . Prostate cancer Neg Hx   . Rectal cancer Neg Hx   . Stomach cancer Neg Hx     Social History Social History   Tobacco Use  . Smoking status: Former Research scientist (life sciences)  . Smokeless tobacco: Never Used  Substance Use Topics  . Alcohol use: No  . Drug use: No     Allergies   Patient has no known allergies.   Review of Systems Review of Systems  Constitutional: Positive for activity change. Negative for appetite change, chills, fatigue and fever.  Eyes: Negative for photophobia and visual disturbance.  Respiratory: Negative for chest tightness, shortness of breath and wheezing.   Cardiovascular: Negative for chest pain and leg swelling.  Gastrointestinal: Negative for abdominal pain, nausea and vomiting.  Musculoskeletal: Positive for arthralgias, gait problem and myalgias. Negative for back pain and joint swelling.  Skin: Negative for color change,  rash and wound.  Allergic/Immunologic: Negative for environmental allergies and immunocompromised state.  Neurological: Negative for dizziness, tremors, seizures, syncope, speech difficulty, weakness, light-headedness, numbness and headaches.  Hematological: Negative for adenopathy. Does not bruise/bleed easily.  Psychiatric/Behavioral: Negative.      Physical Exam Triage Vital Signs ED Triage Vitals  Enc Vitals Group     BP 03/24/18 1528 (!) 146/88     Pulse Rate 03/24/18 1528 85     Resp --      Temp 03/24/18 1528 98.1 F (36.7 C)     Temp Source 03/24/18 1528 Oral     SpO2 03/24/18 1528 94 %     Weight --      Height --      Head Circumference --      Peak Flow --      Pain Score 03/24/18 1526 6     Pain Loc --      Pain  Edu? --      Excl. in [place]? --    No data found.  Updated Vital Signs BP (!) 146/88 (BP Location: Right Arm)   Pulse 85   Temp 98.1 F (36.7 C) (Oral)   SpO2 94%   Visual Acuity Right Eye Distance:   Left Eye Distance:   Bilateral Distance:    Right Eye Near:   Left Eye Near:    Bilateral Near:     Physical Exam  Constitutional: He is oriented to person, place, and time. He appears well-developed and well-nourished. He is cooperative. He does not appear ill. No distress.  Patient sitting sideways on exam table in no acute distress but appears to be having difficulty finding a comfortable position to sit.   HENT:  Head: Normocephalic and atraumatic.  Eyes: Conjunctivae and EOM are normal.  Neck: Normal range of motion.  Cardiovascular: Normal rate.  Pulmonary/Chest: Effort normal.  Musculoskeletal: He exhibits tenderness. He exhibits no edema.       Left upper leg: He exhibits tenderness. He exhibits no swelling, no edema and no laceration.       Legs:      Right foot: There is tenderness. There is normal range of motion, no swelling, normal capillary refill, no crepitus, no deformity and no laceration.       Feet:  Has decreased range of motion of left upper leg/thigh due to pain. Tender along hamstring from just above posterior knee to mid to upper thigh below buttock. No distinct muscle bulging, spasms, redness, swelling present. No numbness. Good distal pulses and no neuro deficits noted.   Right heel and foot has full range of motion. Only slightly tender with significant pressure to right calcaneus area. No distinct swelling. No redness. Good pulses and capillary refill. No neuro deficits noted.   Neurological: He is alert and oriented to person, place, and time. He has normal strength. No sensory deficit. Gait abnormal.  Patient limping due to pain of left upper leg.   Skin: Skin is warm and dry. Capillary refill takes less than 2 seconds. No rash noted. No erythema.    Psychiatric: He has a normal mood and affect. His behavior is normal. Judgment and thought content normal.  Vitals reviewed.    UC Treatments / Results  Labs (all labs ordered are listed, but only abnormal results are displayed) Labs Reviewed - No data to display  EKG None  Radiology Dg Os Calcis Right  Result Date: 03/24/2018 CLINICAL DATA:  Continued right heel pain for the past  week. No known injury. EXAM: RIGHT OS CALCIS - 2+ VIEW COMPARISON:  None. FINDINGS: Minimal inferior and posterior calcaneal spur formation. Small amount of arterial calcification. Mild dorsal tarsal spur formation. IMPRESSION: Mild degenerative changes. Electronically Signed   By: Claudie Revering M.D.   On: 03/24/2018 16:46   Dg Femur Min 2 Views Left  Result Date: 03/24/2018 CLINICAL DATA:  Left thigh pain after feeling a pop in his left hip after twisting injury yesterday. EXAM: LEFT FEMUR 2 VIEWS COMPARISON:  None. FINDINGS: There is no evidence of fracture or other focal bone lesions. Sequelae of prior Osgood-Schlatter disease. Soft tissues are unremarkable. IMPRESSION: Negative. Electronically Signed   By: Titus Dubin M.D.   On: 03/24/2018 16:44    Procedures Procedures (including critical care time)  Medications Ordered in UC Medications - No data to display  Initial Impression / Assessment and Plan / UC Course  I have reviewed the triage vital signs and the nursing notes.  Pertinent labs & imaging results that were available during my care of the patient were reviewed by me and considered in my medical decision making (see chart for details).    Reviewed x-ray findings of right heel- minimal heel spurs present. Discussed that usual treatment is conservative with anti-inflammatories and orthotics. Patient had heel spur in left foot in past and has seen a Podiatrist (wife looking up information). Recommend call Dr. Barkley Bruns tomorrow to schedule appointment for follow-up.   Wife would like an  x-ray today of his left leg to confirm no fracture or other acute bone abnormality. Reviewed negative x-ray findings of left leg. Discussed that he appears to have pulled his hamstring muscle. Doubt tear at this time but will need to continue to monitor symptoms. Applied ace wrap to thigh for support.  Recommend trial Diclofenac 75 mg every 12 hours as needed for leg and foot pain.  May apply warm compresses to leg as needed for comfort.  Note written to be out of work for 3 days due to injury since he climbs steps and stairs multiple times daily. Discussed any further restrictions or time off from work needs to come from his PCP.  Recommend follow-up with his PCP if leg pain does not improve within 3 to 4 days. Final Clinical Impressions(s) / UC Diagnoses   Final diagnoses:  Left leg pain  Pain of right heel  Heel spur, right     Discharge Instructions     Recommend take Diclofenac 75mg  every 12 hours as needed for leg and foot pain. May apply warm compresses to leg as needed for comfort. Wear ace wrap for support. Follow-up with the Podiatrist (Foot doctor) for right heel pain and follow-up with your PCP if left leg pain does not improve within 3 to 4 days.     ED Prescriptions    Medication Sig Dispense Auth. Provider   diclofenac (VOLTAREN) 75 MG EC tablet Take 1 tablet (75 mg total) by mouth 2 (two) times daily as needed for moderate pain. 20 tablet Katy Apo, NP     Controlled Substance Prescriptions [place] Controlled Substance Registry consulted? Not Applicable   Katy Apo, NP 03/25/18 1239

## 2018-03-24 NOTE — ED Triage Notes (Signed)
Pt reports having right heel pain x1 week.  Today he states he moved wrong and felt a pop in his posterior left thigh.

## 2018-04-17 ENCOUNTER — Ambulatory Visit (INDEPENDENT_AMBULATORY_CARE_PROVIDER_SITE_OTHER): Payer: 59

## 2018-04-17 ENCOUNTER — Encounter: Payer: Self-pay | Admitting: Podiatry

## 2018-04-17 ENCOUNTER — Ambulatory Visit (INDEPENDENT_AMBULATORY_CARE_PROVIDER_SITE_OTHER): Payer: 59 | Admitting: Podiatry

## 2018-04-17 VITALS — BP 136/72 | HR 75 | Resp 16

## 2018-04-17 DIAGNOSIS — M722 Plantar fascial fibromatosis: Secondary | ICD-10-CM

## 2018-04-17 MED ORDER — DICLOFENAC SODIUM 75 MG PO TBEC: 75.0000 mg | DELAYED_RELEASE_TABLET | Freq: Two times a day (BID) | ORAL | 2 refills | Status: DC

## 2018-04-17 MED ORDER — TRIAMCINOLONE ACETONIDE 10 MG/ML IJ SUSP
10.0000 mg | Freq: Once | INTRAMUSCULAR | Status: AC
  Administered 2018-04-17: 10 mg

## 2018-04-17 NOTE — Progress Notes (Signed)
   Subjective:    Patient ID: Jonathan Marks, male    DOB: [birth year], 62 y.o.   MRN: [id number]  HPI    Review of Systems  All other systems reviewed and are negative.      Objective:   Physical Exam        Assessment & Plan:

## 2018-04-17 NOTE — Progress Notes (Signed)
Subjective:   Patient ID: Jonathan Marks, male   DOB: 62 y.o.   MRN: [id number]   HPI Patient points to the right heel states is been very tender and also mild pain left and states he did have surgery on the left one for the state prior probably has of the right would.  States is been hurting for around a month or injury and patient does not smoke and likes to be active   Review of Systems  All other systems reviewed and are negative.       Objective:  Physical Exam  Constitutional: He appears well-developed and well-nourished.  Cardiovascular: Intact distal pulses.  Pulmonary/Chest: Effort normal.  Musculoskeletal: Normal range of motion.  Neurological: He is alert.  Skin: Skin is warm.  Nursing note and vitals reviewed.   Neurovascular status intact muscle strength is adequate range of motion within normal limits with patient found to have exquisite discomfort plantar aspect right heel at the insertional point tendon into the calcaneus with inflammation fluid buildup around the medial band and mild discomfort left but not acute or significant.  Patient has good digital perfusion is well oriented x3     Assessment:  Acute plantar fasciitis right with inflammation fluid of the medial band     Plan:  H&P x-rays reviewed and today and did sterile prep and then injected directly into the medial fascial band 3 mg Kenalog 5 mg Xylocaine and applied fascial brace.  I gave instructions no physical therapy and shoe gear modifications and placed on diclofenac 75 mg twice daily and reappoint to recheck again in the next week  X-ray indicates spur with no indications of stress fracture arthritis

## 2018-04-17 NOTE — Patient Instructions (Signed)

## 2018-04-24 ENCOUNTER — Ambulatory Visit (INDEPENDENT_AMBULATORY_CARE_PROVIDER_SITE_OTHER): Payer: 59 | Admitting: Podiatry

## 2018-04-24 ENCOUNTER — Encounter: Payer: Self-pay | Admitting: Podiatry

## 2018-04-24 DIAGNOSIS — M722 Plantar fascial fibromatosis: Secondary | ICD-10-CM

## 2018-04-24 MED ORDER — TRIAMCINOLONE ACETONIDE 10 MG/ML IJ SUSP
10.0000 mg | Freq: Once | INTRAMUSCULAR | Status: AC
  Administered 2018-04-24: 10 mg

## 2018-04-27 NOTE — Progress Notes (Signed)
Subjective:   Patient ID: Jonathan Marks, male   DOB: 62 y.o.   MRN: [id number]   HPI Patient states heel is feeling quite a bit better with mild discomfort still noted but overall improvement   ROS      Objective:  Physical Exam  Neurovascular status intact with patient's right foot doing better with pain still noted upon deep palpation of the plantar fascia     Assessment:  Improvement fasciitis with pain still present but improved     Plan:  Advised on anti-inflammatories and I reinjected the plantar fascia 3 mg Kenalog 5 g Xylocaine advised on supportive shoes and reappoint 4 weeks or earlier if needed

## 2018-05-22 ENCOUNTER — Ambulatory Visit (INDEPENDENT_AMBULATORY_CARE_PROVIDER_SITE_OTHER): Payer: 59 | Admitting: Podiatry

## 2018-05-22 ENCOUNTER — Encounter: Payer: Self-pay | Admitting: Podiatry

## 2018-05-22 DIAGNOSIS — M722 Plantar fascial fibromatosis: Secondary | ICD-10-CM | POA: Diagnosis not present

## 2018-05-22 MED ORDER — TRIAMCINOLONE ACETONIDE 10 MG/ML IJ SUSP
10.0000 mg | Freq: Once | INTRAMUSCULAR | Status: AC
  Administered 2018-05-22: 10 mg

## 2018-05-23 NOTE — Progress Notes (Signed)
Subjective:   Patient ID: Jonathan Marks, male   DOB: 63 y.o.   MRN: [id number]   HPI Patient presents stating I have had some improvement but I am still having quite a bit of discomfort in the plantar aspect of my right heel   ROS      Objective:  Physical Exam  Neurovascular status intact with continued discomfort plantar aspect right heel at the insertional point of the tendon into the calcaneus     Assessment:  Acute plantar fasciitis right inflammation fluid around the medial band     Plan:  Reviewed continued supportive shoe gear usage and modifications and I advised that ultimately orthotics may be necessary in the possibility for surgical intervention.  At this point I did sterile prep and reinjected the fascia 3 mg Kenalog 5 mg Xylocaine applied sterile dressing and reappoint to recheck in 6 weeks or earlier if needed

## 2018-07-03 ENCOUNTER — Ambulatory Visit (INDEPENDENT_AMBULATORY_CARE_PROVIDER_SITE_OTHER): Payer: 59 | Admitting: Podiatry

## 2018-07-03 ENCOUNTER — Encounter: Payer: Self-pay | Admitting: Podiatry

## 2018-07-03 DIAGNOSIS — M722 Plantar fascial fibromatosis: Secondary | ICD-10-CM | POA: Diagnosis not present

## 2018-07-04 NOTE — Progress Notes (Signed)
Subjective:   Patient ID: Jonathan Marks, male   DOB: 63 y.o.   MRN: [id number]   HPI Patient states he is doing quite a bit better but still is having some pain in his heel   ROS      Objective:  Physical Exam  Neurovascular status found to be intact muscle strength is adequate range of motion within normal limits with patient's heel doing much better than previously with pain still noted upon deep palpation     Assessment:  Acute plantar fasciitis improving     Plan:  HEP condition reviewed and recommended continued physical therapy anti-inflammatories and support.  Ultimately may require customized orthotic devices

## 2018-07-16 ENCOUNTER — Ambulatory Visit (INDEPENDENT_AMBULATORY_CARE_PROVIDER_SITE_OTHER): Payer: 59 | Admitting: Adult Health

## 2018-07-16 ENCOUNTER — Encounter: Payer: Self-pay | Admitting: Adult Health

## 2018-07-16 ENCOUNTER — Encounter: Payer: Self-pay | Admitting: Family Medicine

## 2018-07-16 VITALS — BP 146/80 | Temp 98.2°F | Wt 223.0 lb

## 2018-07-16 DIAGNOSIS — B9789 Other viral agents as the cause of diseases classified elsewhere: Secondary | ICD-10-CM

## 2018-07-16 DIAGNOSIS — J329 Chronic sinusitis, unspecified: Secondary | ICD-10-CM | POA: Diagnosis not present

## 2018-07-16 LAB — POC INFLUENZA A&B (BINAX/QUICKVUE)
Influenza A, POC: NEGATIVE
Influenza B, POC: NEGATIVE

## 2018-07-16 MED ORDER — FLUTICASONE PROPIONATE 50 MCG/ACT NA SUSP: 2.0000 | Freq: Every day | NASAL | 6 refills | Status: DC

## 2018-07-16 NOTE — Patient Instructions (Signed)
It was great meeting you today   Your exam is consistent with viral sinusitis   I have sent in Flonase for you to use daily.   Rest and stay hydrated   Follow up if no improvement in the next 3-5 days    General Recommendations:    Please drink plenty of fluids.  Get plenty of rest   Sleep in humidified air  Use saline nasal sprays  Netti pot   OTC Medications:  Decongestants - helps relieve congestion   Flonase (generic fluticasone) or Nasacort (generic triamcinolone) - please make sure to use the "cross-over" technique at a 45 degree angle towards the opposite eye as opposed to straight up the nasal passageway.   Sudafed (generic pseudoephedrine - Note this is the one that is available behind the pharmacy counter); Products with phenylephrine (-PE) may also be used but is often not as effective as pseudoephedrine.   If you have HIGH BLOOD PRESSURE - Coricidin HBP; AVOID any product that is -D as this contains pseudoephedrine which may increase your blood pressure.  Afrin (oxymetazoline) every 6-8 hours for up to 3 days.   Allergies - helps relieve runny nose, itchy eyes and sneezing   Claritin (generic loratidine), Allegra (fexofenidine), or Zyrtec (generic cyrterizine) for runny nose. These medications should not cause drowsiness.  Note - Benadryl (generic diphenhydramine) may be used however may cause drowsiness  Cough -   Delsym or Robitussin (generic dextromethorphan)  Expectorants - helps loosen mucus to ease removal   Mucinex (generic guaifenesin) as directed on the package.  Headaches / General Aches   Tylenol (generic acetaminophen) - DO NOT EXCEED 3 grams (3,000 mg) in a 24 hour time period  Advil/Motrin (generic ibuprofen)   Sore Throat -   Salt water gargle   Chloraseptic (generic benzocaine) spray or lozenges / Sucrets (generic dyclonine)    Sinusitis Sinusitis is redness, soreness, and inflammation of the paranasal sinuses.  Paranasal sinuses are air pockets within the bones of your face (beneath the eyes, the middle of the forehead, or above the eyes). In healthy paranasal sinuses, mucus is able to drain out, and air is able to circulate through them by way of your nose. However, when your paranasal sinuses are inflamed, mucus and air can become trapped. This can allow bacteria and other germs to grow and cause infection. Sinusitis can develop quickly and last only a short time (acute) or continue over a long period (chronic). Sinusitis that lasts for more than 12 weeks is considered chronic.  CAUSES  Causes of sinusitis include:  Allergies.  Structural abnormalities, such as displacement of the cartilage that separates your nostrils (deviated septum), which can decrease the air flow through your nose and sinuses and affect sinus drainage.  Functional abnormalities, such as when the small hairs (cilia) that line your sinuses and help remove mucus do not work properly or are not present. SIGNS AND SYMPTOMS  Symptoms of acute and chronic sinusitis are the same. The primary symptoms are pain and pressure around the affected sinuses. Other symptoms include:  Upper toothache.  Earache.  Headache.  Bad breath.  Decreased sense of smell and taste.  A cough, which worsens when you are lying flat.  Fatigue.  Fever.  Thick drainage from your nose, which often is green and may contain pus (purulent).  Swelling and warmth over the affected sinuses. DIAGNOSIS  Your health care provider will perform a physical exam. During the exam, your health care provider may:  Look  in your nose for signs of abnormal growths in your nostrils (nasal polyps).  Tap over the affected sinus to check for signs of infection.  View the inside of your sinuses (endoscopy) using an imaging device that has a light attached (endoscope). If your health care provider suspects that you have chronic sinusitis, one or more of the following  tests may be recommended:  Allergy tests.  Nasal culture. A sample of mucus is taken from your nose, sent to a lab, and screened for bacteria.  Nasal cytology. A sample of mucus is taken from your nose and examined by your health care provider to determine if your sinusitis is related to an allergy. TREATMENT  Most cases of acute sinusitis are related to a viral infection and will resolve on their own within 10 days. Sometimes medicines are prescribed to help relieve symptoms (pain medicine, decongestants, nasal steroid sprays, or saline sprays).  However, for sinusitis related to a bacterial infection, your health care provider will prescribe antibiotic medicines. These are medicines that will help kill the bacteria causing the infection.  Rarely, sinusitis is caused by a fungal infection. In theses cases, your health care provider will prescribe antifungal medicine. For some cases of chronic sinusitis, surgery is needed. Generally, these are cases in which sinusitis recurs more than 3 times per year, despite other treatments. HOME CARE INSTRUCTIONS   Drink plenty of water. Water helps thin the mucus so your sinuses can drain more easily.  Use a humidifier.  Inhale steam 3 to 4 times a day (for example, sit in the bathroom with the shower running).  Apply a warm, moist washcloth to your face 3 to 4 times a day, or as directed by your health care provider.  Use saline nasal sprays to help moisten and clean your sinuses.  Take medicines only as directed by your health care provider.  If you were prescribed either an antibiotic or antifungal medicine, finish it all even if you start to feel better. SEEK IMMEDIATE MEDICAL CARE IF:  You have increasing pain or severe headaches.  You have nausea, vomiting, or drowsiness.  You have swelling around your face.  You have vision problems.  You have a stiff neck.  You have difficulty breathing. MAKE SURE YOU:   Understand these  instructions.  Will watch your condition.  Will get help right away if you are not doing well or get worse. Document Released: 04/24/2005 Document Revised: 09/08/2013 Document Reviewed: 05/09/2011 [facility] Patient Information 2015 [place], [place]. This information is not intended to replace advice given to you by your health care provider. Make sure you discuss any questions you have with your health care provider.

## 2018-07-16 NOTE — Progress Notes (Signed)
Subjective:    Patient ID: Jonathan Marks, male    DOB: [birth year], 63 y.o.   MRN: [id number]  Sinusitis  This is a new problem. The current episode started in the past 7 days (2 days ). The problem is unchanged. There has been no fever. Associated symptoms include chills, congestion, headaches, sinus pressure and a sore throat. Pertinent negatives include no coughing, diaphoresis, ear pain, hoarse voice, neck pain, shortness of breath, sneezing or swollen glands. Past treatments include nothing.       Review of Systems  Constitutional: Positive for chills and fatigue. Negative for activity change, appetite change, diaphoresis and fever.  HENT: Positive for congestion, rhinorrhea, sinus pressure, sinus pain and sore throat. Negative for ear pain, hoarse voice, sneezing and trouble swallowing.   Respiratory: Negative for cough and shortness of breath.   Cardiovascular: Negative.   Gastrointestinal: Negative.   Musculoskeletal: Negative for neck pain.  Neurological: Positive for headaches.   Past Medical History:  Diagnosis Date  . Prostatitis   . Unspecified essential hypertension 01/21/2007    Social History   Socioeconomic History  . Marital status: Married    Spouse name: Not on file  . Number of children: Not on file  . Years of education: Not on file  . Highest education level: Not on file  Occupational History  . Not on file  Social Needs  . Financial resource strain: Not on file  . Food insecurity:    Worry: Not on file    Inability: Not on file  . Transportation needs:    Medical: Not on file    Non-medical: Not on file  Tobacco Use  . Smoking status: Former Research scientist (life sciences)  . Smokeless tobacco: Never Used  Substance and Sexual Activity  . Alcohol use: No  . Drug use: No  . Sexual activity: Not on file    Comment: 1 daughter, 2 grandchildren  Lifestyle  . Physical activity:    Days per week: Not on file    Minutes per session: Not on file  . Stress: Not on file    Relationships  . Social connections:    Talks on phone: Not on file    Gets together: Not on file    Attends religious service: Not on file    Active member of club or organization: Not on file    Attends meetings of clubs or organizations: Not on file    Relationship status: Not on file  . Intimate partner violence:    Fear of current or ex partner: Not on file    Emotionally abused: Not on file    Physically abused: Not on file    Forced sexual activity: Not on file  Other Topics Concern  . Not on file  Social History Narrative  . Not on file    Past Surgical History:  Procedure Laterality Date  .  left plantar fasciotomy  October 2012  . COLONOSCOPY     age 55  . HERNIA REPAIR    . INGUINAL HERNIA REPAIR  2001   left , Dr.Weatherly  . WISDOM TOOTH EXTRACTION      Family History  Problem Relation Age of Onset  . Hypertension Mother   . Alcohol abuse Father   . Stroke Father   . Pancreatic cancer Sister   . Alcohol abuse Other   . Hypertension Other   . Colon cancer Neg Hx   . Esophageal cancer Neg Hx   . Prostate cancer Neg  Hx   . Rectal cancer Neg Hx   . Stomach cancer Neg Hx     No Known Allergies  Current Outpatient Medications on File Prior to Visit  Medication Sig Dispense Refill  . benazepril-hydrochlorthiazide (LOTENSIN HCT) 20-12.5 MG tablet Take 1 tablet by mouth daily. 90 tablet 6  . diclofenac (VOLTAREN) 75 MG EC tablet Take 1 tablet (75 mg total) by mouth 2 (two) times daily. 50 tablet 2   No current facility-administered medications on file prior to visit.     BP (!) 146/80   Temp 98.2 F (36.8 C)   Wt 223 lb (101.2 kg)   BMI 31.93 kg/m       Objective:   Physical Exam Vitals signs and nursing note reviewed.  Constitutional:      Appearance: Normal appearance.  HENT:     Right Ear: Tympanic membrane, ear canal and external ear normal.     Left Ear: Tympanic membrane, ear canal and external ear normal.     Nose: Rhinorrhea  (clear) present. No congestion.     Mouth/Throat:     Mouth: Mucous membranes are moist.     Pharynx: Oropharynx is clear. No oropharyngeal exudate or posterior oropharyngeal erythema.  Cardiovascular:     Rate and Rhythm: Normal rate and regular rhythm.     Pulses: Normal pulses.     Heart sounds: Normal heart sounds.  Pulmonary:     Effort: Pulmonary effort is normal.     Breath sounds: Normal breath sounds.  Musculoskeletal: Normal range of motion.  Skin:    General: Skin is warm and dry.     Capillary Refill: Capillary refill takes less than 2 seconds.  Neurological:     General: No focal deficit present.     Mental Status: He is alert and oriented to person, place, and time.       Assessment & Plan:  1. Viral sinusitis - Rest and hydrate - Return precautions reviewed  - POC Influenza A&B(BINAX/QUICKVUE) - fluticasone (FLONASE) 50 MCG/ACT nasal spray; Place 2 sprays into both nostrils daily.  Dispense: 16 g; Refill: 6   Film/video editor

## 2018-07-19 ENCOUNTER — Ambulatory Visit (INDEPENDENT_AMBULATORY_CARE_PROVIDER_SITE_OTHER): Payer: 59 | Admitting: Internal Medicine

## 2018-07-19 ENCOUNTER — Encounter: Payer: Self-pay | Admitting: Internal Medicine

## 2018-07-19 ENCOUNTER — Other Ambulatory Visit: Payer: Self-pay

## 2018-07-19 VITALS — BP 130/80 | HR 87 | Temp 98.3°F | Ht 70.0 in | Wt 226.0 lb

## 2018-07-19 DIAGNOSIS — E669 Obesity, unspecified: Secondary | ICD-10-CM | POA: Diagnosis not present

## 2018-07-19 DIAGNOSIS — I10 Essential (primary) hypertension: Secondary | ICD-10-CM | POA: Diagnosis not present

## 2018-07-19 NOTE — Patient Instructions (Signed)
-  It was nice meeting you today!  -Schedule visit after August for your annual physical.

## 2018-07-19 NOTE — Progress Notes (Signed)
Established Patient Office Visit     CC/Reason for Visit: Establish care, follow-up chronic medical conditions  HPI: Jonathan Marks is a 63 y.o. male who is coming in today for the above mentioned reasons. Past Medical History is significant for: Mild obesity as well as hypertension that has been well controlled.  He has no acute complaints today.  He was seen recently in the office for cold type symptoms and was diagnosed with a viral sinusitis.   Past Medical/Surgical History: Past Medical History:  Diagnosis Date  . Prostatitis   . Unspecified essential hypertension 01/21/2007    Past Surgical History:  Procedure Laterality Date  .  left plantar fasciotomy  October 2012  . COLONOSCOPY     age 53  . HERNIA REPAIR    . INGUINAL HERNIA REPAIR  2001   left , Dr.Weatherly  . WISDOM TOOTH EXTRACTION      Social History:  reports that he has quit smoking. He has never used smokeless tobacco. He reports that he does not drink alcohol or use drugs.  Allergies: No Known Allergies  Family History:  Family History  Problem Relation Age of Onset  . Hypertension Mother   . Alcohol abuse Father   . Stroke Father   . Pancreatic cancer Sister   . Alcohol abuse Other   . Hypertension Other   . Colon cancer Neg Hx   . Esophageal cancer Neg Hx   . Prostate cancer Neg Hx   . Rectal cancer Neg Hx   . Stomach cancer Neg Hx      Current Outpatient Medications:  .  benazepril-hydrochlorthiazide (LOTENSIN HCT) 20-12.5 MG tablet, Take 1 tablet by mouth daily., Disp: 90 tablet, Rfl: 6 .  diclofenac (VOLTAREN) 75 MG EC tablet, Take 1 tablet (75 mg total) by mouth 2 (two) times daily., Disp: 50 tablet, Rfl: 2 .  fluticasone (FLONASE) 50 MCG/ACT nasal spray, Place 2 sprays into both nostrils daily., Disp: 16 g, Rfl: 6  Review of Systems:  Constitutional: Denies fever, chills, diaphoresis, appetite change and fatigue.  HEENT: Denies photophobia, eye pain, redness, hearing loss,  ear pain, congestion, sore throat, rhinorrhea, sneezing, mouth sores, trouble swallowing, neck pain, neck stiffness and tinnitus.   Respiratory: Denies SOB, DOE, cough, chest tightness,  and wheezing.   Cardiovascular: Denies chest pain, palpitations and leg swelling.  Gastrointestinal: Denies nausea, vomiting, abdominal pain, diarrhea, constipation, blood in stool and abdominal distention.  Genitourinary: Denies dysuria, urgency, frequency, hematuria, flank pain and difficulty urinating.  Endocrine: Denies: hot or cold intolerance, sweats, changes in hair or nails, polyuria, polydipsia. Musculoskeletal: Denies myalgias, back pain, joint swelling, arthralgias and gait problem.  Skin: Denies pallor, rash and wound.  Neurological: Denies dizziness, seizures, syncope, weakness, light-headedness, numbness and headaches.  Hematological: Denies adenopathy. Easy bruising, personal or family bleeding history  Psychiatric/Behavioral: Denies suicidal ideation, mood changes, confusion, nervousness, sleep disturbance and agitation    Physical Exam: Vitals:   07/19/18 1302  BP: 130/80  Pulse: 87  Temp: 98.3 F (36.8 C)  TempSrc: Oral  SpO2: 95%  Weight: 226 lb (102.5 kg)  Height: 5\' 10"  (1.778 m)    Body mass index is 32.43 kg/m.   Constitutional: NAD, calm, comfortable Eyes: PERRL, lids and conjunctivae normal ENMT: Mucous membranes are moist.  Respiratory: clear to auscultation bilaterally, no wheezing, no crackles. Normal respiratory effort. No accessory muscle use.  Cardiovascular: Regular rate and rhythm, no murmurs / rubs / gallops. No extremity edema. 2+  pedal pulses. No carotid bruits.  Abdomen: no tenderness, no masses palpated. No hepatosplenomegaly. Bowel sounds positive.  Musculoskeletal: no clubbing / cyanosis.   Psychiatric: Normal judgment and insight. Alert and oriented x 3. Normal mood.    Impression and Plan:  Essential hypertension -Well-controlled on current  benazepril/hydrochlorothiazide.  Obesity (BMI 30-39.9) -Discussed lifestyle modifications.    Patient Instructions  -It was nice meeting you today!  -Schedule visit after August for your annual physical.     Lelon Frohlich, MD [facility]

## 2018-07-24 ENCOUNTER — Telehealth: Payer: Self-pay | Admitting: Family Medicine

## 2018-07-24 NOTE — Telephone Encounter (Signed)
Received short term disability paper work for the pt.  I believe the pt meant to send FMLA.  Jonathan Marks seen patient on 07/16/2018 for viral sinusitis.  Left a message for the pt to call back.

## 2018-07-24 NOTE — Telephone Encounter (Signed)
Spoke to the pt.  Informed him that I received the paper work but believe that we should have received the FMLA instead since the pt was not/is not disabled.  Pt states he will call to have FMLA sent over.

## 2018-07-25 ENCOUNTER — Telehealth: Payer: Self-pay | Admitting: *Deleted

## 2018-07-25 NOTE — Telephone Encounter (Signed)
Copied from [place] [id number]. Topic: General - Other >> Jul 24, 2018  4:06 PM Yvette Rack wrote: Reason for CRM: Pt returned call to [facility]. Pt requests call back. Cb# [phone]

## 2018-07-30 NOTE — Telephone Encounter (Signed)
Spoke to the pt and he said he spoke to his employer and the correct paper work was sent over.  I have filled out the form and will have Cory sign.  Placed in his folder.

## 2018-07-31 NOTE — Telephone Encounter (Signed)
Paper work Visual merchand[employer] by [employer] and faxed.  Received confirmation the transaction was successful.  Will now send to scan.  Nothing further needed at this time.

## 2018-12-13 ENCOUNTER — Other Ambulatory Visit: Payer: Self-pay

## 2018-12-13 ENCOUNTER — Encounter: Payer: Self-pay | Admitting: Internal Medicine

## 2018-12-13 ENCOUNTER — Ambulatory Visit (INDEPENDENT_AMBULATORY_CARE_PROVIDER_SITE_OTHER): Payer: 59 | Admitting: Internal Medicine

## 2018-12-13 VITALS — BP 130/80 | HR 66 | Temp 98.0°F | Ht 70.0 in | Wt 218.8 lb

## 2018-12-13 DIAGNOSIS — I10 Essential (primary) hypertension: Secondary | ICD-10-CM

## 2018-12-13 DIAGNOSIS — Z0000 Encounter for general adult medical examination without abnormal findings: Secondary | ICD-10-CM

## 2018-12-13 DIAGNOSIS — E669 Obesity, unspecified: Secondary | ICD-10-CM | POA: Diagnosis not present

## 2018-12-13 LAB — COMPREHENSIVE METABOLIC PANEL
ALT: 21 U/L (ref 0–53)
AST: 17 U/L (ref 0–37)
Albumin: 4.2 g/dL (ref 3.5–5.2)
Alkaline Phosphatase: 40 U/L (ref 39–117)
BUN: 17 mg/dL (ref 6–23)
CO2: 28 mEq/L (ref 19–32)
Calcium: 9.1 mg/dL (ref 8.4–10.5)
Chloride: 105 mEq/L (ref 96–112)
Creatinine, Ser: 1.02 mg/dL (ref 0.40–1.50)
GFR: 89.62 mL/min (ref >60.00)
Glucose, Bld: 87 mg/dL (ref 70–99)
Potassium: 4.2 mEq/L (ref 3.5–5.1)
Sodium: 141 mEq/L (ref 135–145)
Total Bilirubin: 0.7 mg/dL (ref 0.2–1.2)
Total Protein: 6.3 g/dL (ref 6.0–8.3)

## 2018-12-13 LAB — LIPID PANEL
Cholesterol: 147 mg/dL (ref 0–200)
HDL: 49.4 mg/dL (ref >39.00)
LDL Cholesterol: 88 mg/dL (ref 0–99)
NonHDL: 97.85
Total CHOL/HDL Ratio: 3
Triglycerides: 50 mg/dL (ref 0.0–149.0)
VLDL: 10 mg/dL (ref 0.0–40.0)

## 2018-12-13 LAB — CBC WITH DIFFERENTIAL/PLATELET
Basophils Absolute: 0 10*3/uL (ref 0.0–0.1)
Basophils Relative: 0.8 % (ref 0.0–3.0)
Eosinophils Absolute: 0.4 10*3/uL (ref 0.0–0.7)
Eosinophils Relative: 7.1 % — ABNORMAL HIGH (ref 0.0–5.0)
HCT: 41.6 % (ref 39.0–52.0)
Hemoglobin: 14.4 g/dL (ref 13.0–17.0)
Lymphocytes Relative: 29.2 % (ref 12.0–46.0)
Lymphs Abs: 1.6 10*3/uL (ref 0.7–4.0)
MCHC: 34.7 g/dL (ref 30.0–36.0)
MCV: 88.6 fl (ref 78.0–100.0)
Monocytes Absolute: 0.7 10*3/uL (ref 0.1–1.0)
Monocytes Relative: 13.5 % — ABNORMAL HIGH (ref 3.0–12.0)
Neutro Abs: 2.7 10*3/uL (ref 1.4–7.7)
Neutrophils Relative %: 49.4 % (ref 43.0–77.0)
Platelets: 193 10*3/uL (ref 150.0–400.0)
RBC: 4.69 Mil/uL (ref 4.22–5.81)
RDW: 13.5 % (ref 11.5–15.5)
WBC: 5.5 10*3/uL (ref 4.0–10.5)

## 2018-12-13 LAB — TSH: TSH: 0.77 u[IU]/mL (ref 0.35–4.50)

## 2018-12-13 LAB — VITAMIN B12: Vitamin B-12: 264 pg/mL (ref 211–911)

## 2018-12-13 LAB — VITAMIN D 25 HYDROXY (VIT D DEFICIENCY, FRACTURES): VITD: 19.6 ng/mL — ABNORMAL LOW (ref 30.00–100.00)

## 2018-12-13 LAB — HEMOGLOBIN A1C: Hgb A1c MFr Bld: 5.3 % (ref 4.6–6.5)

## 2018-12-13 MED ORDER — BENAZEPRIL-HYDROCHLOROTHIAZIDE 20-12.5 MG PO TABS: 1.0000 | ORAL_TABLET | Freq: Every day | ORAL | 1 refills | Status: DC

## 2018-12-13 NOTE — Progress Notes (Signed)
Established Patient Office Visit     CC/Reason for Visit: Annual preventive exam  HPI: Jonathan Marks is a 63 y.o. male who is coming in today for the above mentioned reasons. Past Medical History is significant for: Well-controlled hypertension mild obesity.  He has no acute complaints today.  He has eye care but no dental care, he had a colonoscopy in 2018, we discussed shingles vaccination which she will obtain today, otherwise vaccines today   Past Medical/Surgical History: Past Medical History:  Diagnosis Date  . Prostatitis   . Unspecified essential hypertension 01/21/2007    Past Surgical History:  Procedure Laterality Date  .  left plantar fasciotomy  October 2012  . COLONOSCOPY     age 69  . HERNIA REPAIR    . INGUINAL HERNIA REPAIR  2001   left , Dr.Weatherly  . WISDOM TOOTH EXTRACTION      Social History:  reports that he has quit smoking. He has never used smokeless tobacco. He reports that he does not drink alcohol or use drugs.  Allergies: No Known Allergies  Family History:  Family History  Problem Relation Age of Onset  . Hypertension Mother   . Alcohol abuse Father   . Stroke Father   . Pancreatic cancer Sister   . Alcohol abuse Other   . Hypertension Other   . Colon cancer Neg Hx   . Esophageal cancer Neg Hx   . Prostate cancer Neg Hx   . Rectal cancer Neg Hx   . Stomach cancer Neg Hx      Current Outpatient Medications:  .  benazepril-hydrochlorthiazide (LOTENSIN HCT) 20-12.5 MG tablet, Take 1 tablet by mouth daily., Disp: 90 tablet, Rfl: 1 .  diclofenac (VOLTAREN) 75 MG EC tablet, Take 1 tablet (75 mg total) by mouth 2 (two) times daily., Disp: 50 tablet, Rfl: 2 .  fluticasone (FLONASE) 50 MCG/ACT nasal spray, Place 2 sprays into both nostrils daily., Disp: 16 g, Rfl: 6  Review of Systems:  Constitutional: Denies fever, chills, diaphoresis, appetite change and fatigue.  HEENT: Denies photophobia, eye pain, redness, hearing loss, ear  pain, congestion, sore throat, rhinorrhea, sneezing, mouth sores, trouble swallowing, neck pain, neck stiffness and tinnitus.   Respiratory: Denies SOB, DOE, cough, chest tightness,  and wheezing.   Cardiovascular: Denies chest pain, palpitations and leg swelling.  Gastrointestinal: Denies nausea, vomiting, abdominal pain, diarrhea, constipation, blood in stool and abdominal distention.  Genitourinary: Denies dysuria, urgency, frequency, hematuria, flank pain and difficulty urinating.  Endocrine: Denies: hot or cold intolerance, sweats, changes in hair or nails, polyuria, polydipsia. Musculoskeletal: Denies myalgias, back pain, joint swelling, arthralgias and gait problem.  Skin: Denies pallor, rash and wound.  Neurological: Denies dizziness, seizures, syncope, weakness, light-headedness, numbness and headaches.  Hematological: Denies adenopathy. Easy bruising, personal or family bleeding history  Psychiatric/Behavioral: Denies suicidal ideation, mood changes, confusion, nervousness, sleep disturbance and agitation    Physical Exam: Vitals:   12/13/18 0905  BP: 130/80  Pulse: 66  Temp: 98 F (36.7 C)  TempSrc: Temporal  SpO2: 97%  Weight: 218 lb 12.8 oz (99.2 kg)  Height: '5\' 10"'  (1.778 m)    Body mass index is 31.39 kg/m.   Constitutional: NAD, calm, comfortable Eyes: PERRL, lids and conjunctivae normal, wears corrective lenses ENMT: Mucous membranes are moist. Tympanic membrane is pearly white, no erythema or bulging. Neck: normal, supple, no masses, no thyromegaly Respiratory: clear to auscultation bilaterally, no wheezing, no crackles. Normal respiratory effort. No accessory  muscle use.  Cardiovascular: Regular rate and rhythm, no murmurs / rubs / gallops. No extremity edema. 2+ pedal pulses. No carotid bruits.  Abdomen: no tenderness, no masses palpated. No hepatosplenomegaly. Bowel sounds positive.  Musculoskeletal: no clubbing / cyanosis. No joint deformity upper and lower  extremities. Good ROM, no contractures. Normal muscle tone.  Skin: no rashes, lesions, ulcers. No induration Neurologic: CN 2-12 grossly intact. Sensation intact, DTR normal. Strength 5/5 in all 4.  Psychiatric: Normal judgment and insight. Alert and oriented x 3. Normal mood.    Impression and Plan:  Encounter for preventive health examination  -Has routine eye care. -Have discussed necessity of routine dental care. -He will receive his first shingles vaccination series today, otherwise immunizations are up-to-date and age-appropriate. -We have discussed healthy lifestyle in detail including increased physical activity. -Screening labs to be done today. -He had a colonoscopy in 2018 and is a 10-year recall.  Essential hypertension  -Well-controlled, continue benazepril/hydrochlorothiazide.  Obesity (BMI 30-39.9) -Discussed healthy lifestyle, including increased physical activity and better food choices to promote weight loss.    Patient Instructions  -Nice seeing you today!!  -Lab work today; will notify you once results are available.  -1 of 2 shingles vaccinations today.  -Remember to schedule follow up with your dentist.  -Schedule follow up in 6 months.   Preventive Care 45-72 Years Old, Male Preventive care refers to lifestyle choices and visits with your health care provider that can promote health and wellness. This includes:  A yearly physical exam. This is also called an annual well check.  Regular dental and eye exams.  Immunizations.  Screening for certain conditions.  Healthy lifestyle choices, such as eating a healthy diet, getting regular exercise, not using drugs or products that contain nicotine and tobacco, and limiting alcohol use. What can I expect for my preventive care visit? Physical exam Your health care provider will check:  Height and weight. These may be used to calculate body mass index (BMI), which is a measurement that tells if you are  at a healthy weight.  Heart rate and blood pressure.  Your skin for abnormal spots. Counseling Your health care provider may ask you questions about:  Alcohol, tobacco, and drug use.  Emotional well-being.  Home and relationship well-being.  Sexual activity.  Eating habits.  Work and work Statistician. What immunizations do I need?  Influenza (flu) vaccine  This is recommended every year. Tetanus, diphtheria, and pertussis (Tdap) vaccine  You may need a Td booster every 10 years. Varicella (chickenpox) vaccine  You may need this vaccine if you have not already been vaccinated. Zoster (shingles) vaccine  You may need this after age 29. Measles, mumps, and rubella (MMR) vaccine  You may need at least one dose of MMR if you were born in 1957 or later. You may also need a second dose. Pneumococcal conjugate (PCV13) vaccine  You may need this if you have certain conditions and were not previously vaccinated. Pneumococcal polysaccharide (PPSV23) vaccine  You may need one or two doses if you smoke cigarettes or if you have certain conditions. Meningococcal conjugate (MenACWY) vaccine  You may need this if you have certain conditions. Hepatitis A vaccine  You may need this if you have certain conditions or if you travel or work in places where you may be exposed to hepatitis A. Hepatitis B vaccine  You may need this if you have certain conditions or if you travel or work in places where you may be  exposed to hepatitis B. Haemophilus influenzae type b (Hib) vaccine  You may need this if you have certain risk factors. Human papillomavirus (HPV) vaccine  If recommended by your health care provider, you may need three doses over 6 months. You may receive vaccines as individual doses or as more than one vaccine together in one shot (combination vaccines). Talk with your health care provider about the risks and benefits of combination vaccines. What tests do I need? Blood  tests  Lipid and cholesterol levels. These may be checked every 5 years, or more frequently if you are over 20 years old.  Hepatitis C test.  Hepatitis B test. Screening  Lung cancer screening. You may have this screening every year starting at age 77 if you have a 30-pack-year history of smoking and currently smoke or have quit within the past 15 years.  Prostate cancer screening. Recommendations will vary depending on your family history and other risks.  Colorectal cancer screening. All adults should have this screening starting at age 53 and continuing until age 74. Your health care provider may recommend screening at age 38 if you are at increased risk. You will have tests every 1-10 years, depending on your results and the type of screening test.  Diabetes screening. This is done by checking your blood sugar (glucose) after you have not eaten for a while (fasting). You may have this done every 1-3 years.  Sexually transmitted disease (STD) testing. Follow these instructions at home: Eating and drinking  Eat a diet that includes fresh fruits and vegetables, whole grains, lean protein, and low-fat dairy products.  Take vitamin and mineral supplements as recommended by your health care provider.  Do not drink alcohol if your health care provider tells you not to drink.  If you drink alcohol: ? Limit how much you have to 0-2 drinks a day. ? Be aware of how much alcohol is in your drink. In the [place] one drink equals one 12 oz bottle of beer (355 mL), one 5 oz glass of wine (148 mL), or one 1 oz glass of hard liquor (44 mL). Lifestyle  Take daily care of your teeth and gums.  Stay active. Exercise for at least 30 minutes on 5 or more days each week.  Do not use any products that contain nicotine or tobacco, such as cigarettes, e-cigarettes, and chewing tobacco. If you need help quitting, ask your health care provider.  If you are sexually active, practice safe sex. Use a  condom or other form of protection to prevent STIs (sexually transmitted infections).  Talk with your health care provider about taking a low-dose aspirin every day starting at age 44. What's next?  Go to your health care provider once a year for a well check visit.  Ask your health care provider how often you should have your eyes and teeth checked.  Stay up to date on all vaccines. This information is not intended to replace advice given to you by your health care provider. Make sure you discuss any questions you have with your health care provider. Document Released: 05/21/2015 Document Revised: 04/18/2018 Document Reviewed: 04/18/2018 Elsevier Patient Education  2020 [place], MD [facility]  1

## 2018-12-13 NOTE — Addendum Note (Signed)
Addended by: Elmer Picker on: 12/13/2018 09:36 AM   Modules accepted: Orders

## 2018-12-13 NOTE — Patient Instructions (Signed)
-Nice seeing you today!!  -Lab work today; will notify you once results are available.  -1 of 2 shingles vaccinations today.  -Remember to schedule follow up with your dentist.  -Schedule follow up in 6 months.   Preventive Care 67-62 Years Old, Male Preventive care refers to lifestyle choices and visits with your health care provider that can promote health and wellness. This includes:  A yearly physical exam. This is also called an annual well check.  Regular dental and eye exams.  Immunizations.  Screening for certain conditions.  Healthy lifestyle choices, such as eating a healthy diet, getting regular exercise, not using drugs or products that contain nicotine and tobacco, and limiting alcohol use. What can I expect for my preventive care visit? Physical exam Your health care provider will check:  Height and weight. These may be used to calculate body mass index (BMI), which is a measurement that tells if you are at a healthy weight.  Heart rate and blood pressure.  Your skin for abnormal spots. Counseling Your health care provider may ask you questions about:  Alcohol, tobacco, and drug use.  Emotional well-being.  Home and relationship well-being.  Sexual activity.  Eating habits.  Work and work Statistician. What immunizations do I need?  Influenza (flu) vaccine  This is recommended every year. Tetanus, diphtheria, and pertussis (Tdap) vaccine  You may need a Td booster every 10 years. Varicella (chickenpox) vaccine  You may need this vaccine if you have not already been vaccinated. Zoster (shingles) vaccine  You may need this after age 70. Measles, mumps, and rubella (MMR) vaccine  You may need at least one dose of MMR if you were born in 1957 or later. You may also need a second dose. Pneumococcal conjugate (PCV13) vaccine  You may need this if you have certain conditions and were not previously vaccinated. Pneumococcal polysaccharide  (PPSV23) vaccine  You may need one or two doses if you smoke cigarettes or if you have certain conditions. Meningococcal conjugate (MenACWY) vaccine  You may need this if you have certain conditions. Hepatitis A vaccine  You may need this if you have certain conditions or if you travel or work in places where you may be exposed to hepatitis A. Hepatitis B vaccine  You may need this if you have certain conditions or if you travel or work in places where you may be exposed to hepatitis B. Haemophilus influenzae type b (Hib) vaccine  You may need this if you have certain risk factors. Human papillomavirus (HPV) vaccine  If recommended by your health care provider, you may need three doses over 6 months. You may receive vaccines as individual doses or as more than one vaccine together in one shot (combination vaccines). Talk with your health care provider about the risks and benefits of combination vaccines. What tests do I need? Blood tests  Lipid and cholesterol levels. These may be checked every 5 years, or more frequently if you are over 13 years old.  Hepatitis C test.  Hepatitis B test. Screening  Lung cancer screening. You may have this screening every year starting at age 6 if you have a 30-pack-year history of smoking and currently smoke or have quit within the past 15 years.  Prostate cancer screening. Recommendations will vary depending on your family history and other risks.  Colorectal cancer screening. All adults should have this screening starting at age 86 and continuing until age 61. Your health care provider may recommend screening at age 58  if you are at increased risk. You will have tests every 1-10 years, depending on your results and the type of screening test.  Diabetes screening. This is done by checking your blood sugar (glucose) after you have not eaten for a while (fasting). You may have this done every 1-3 years.  Sexually transmitted disease (STD)  testing. Follow these instructions at home: Eating and drinking  Eat a diet that includes fresh fruits and vegetables, whole grains, lean protein, and low-fat dairy products.  Take vitamin and mineral supplements as recommended by your health care provider.  Do not drink alcohol if your health care provider tells you not to drink.  If you drink alcohol: ? Limit how much you have to 0-2 drinks a day. ? Be aware of how much alcohol is in your drink. In the [place] one drink equals one 12 oz bottle of beer (355 mL), one 5 oz glass of wine (148 mL), or one 1 oz glass of hard liquor (44 mL). Lifestyle  Take daily care of your teeth and gums.  Stay active. Exercise for at least 30 minutes on 5 or more days each week.  Do not use any products that contain nicotine or tobacco, such as cigarettes, e-cigarettes, and chewing tobacco. If you need help quitting, ask your health care provider.  If you are sexually active, practice safe sex. Use a condom or other form of protection to prevent STIs (sexually transmitted infections).  Talk with your health care provider about taking a low-dose aspirin every day starting at age 17. What's next?  Go to your health care provider once a year for a well check visit.  Ask your health care provider how often you should have your eyes and teeth checked.  Stay up to date on all vaccines. This information is not intended to replace advice given to you by your health care provider. Make sure you discuss any questions you have with your health care provider. Document Released: 05/21/2015 Document Revised: 04/18/2018 Document Reviewed: 04/18/2018 Elsevier Patient Education  2020 [employer].

## 2018-12-17 ENCOUNTER — Encounter: Payer: Self-pay | Admitting: Internal Medicine

## 2018-12-17 ENCOUNTER — Other Ambulatory Visit: Payer: Self-pay | Admitting: Internal Medicine

## 2018-12-17 DIAGNOSIS — E559 Vitamin D deficiency, unspecified: Secondary | ICD-10-CM | POA: Insufficient documentation

## 2018-12-17 MED ORDER — VITAMIN D (ERGOCALCIFEROL) 1.25 MG (50000 UNIT) PO CAPS: 50000.0000 [IU] | ORAL_CAPSULE | ORAL | 0 refills | Status: DC

## 2019-03-06 ENCOUNTER — Other Ambulatory Visit: Payer: Self-pay | Admitting: Internal Medicine

## 2019-03-06 DIAGNOSIS — E559 Vitamin D deficiency, unspecified: Secondary | ICD-10-CM

## 2019-06-10 ENCOUNTER — Telehealth: Payer: Self-pay | Admitting: Internal Medicine

## 2019-06-10 NOTE — Telephone Encounter (Signed)
LMVM for the patient to contact the office to reschedule appointment or change to the 3:45 that is on hold for the patient.

## 2019-06-11 NOTE — Telephone Encounter (Signed)
Patient is scheduled for 08/15/2019 at 11:30 AM

## 2019-06-17 ENCOUNTER — Telehealth: Payer: 59 | Admitting: Internal Medicine

## 2019-08-15 ENCOUNTER — Ambulatory Visit: Payer: 59 | Admitting: Internal Medicine

## 2019-09-02 ENCOUNTER — Other Ambulatory Visit: Payer: Self-pay | Admitting: Internal Medicine

## 2019-09-02 DIAGNOSIS — I10 Essential (primary) hypertension: Secondary | ICD-10-CM

## 2020-02-21 ENCOUNTER — Other Ambulatory Visit: Payer: Self-pay | Admitting: Internal Medicine

## 2020-02-21 DIAGNOSIS — I10 Essential (primary) hypertension: Secondary | ICD-10-CM

## 2020-03-13 IMAGING — DX DG OS CALCIS 2+V*R*
2 series · 2 of 2 positions shown · non-contrast
Comparison: None.

CLINICAL DATA: Continued right heel pain for the past week. No
known injury.

EXAM:
RIGHT OS CALCIS - 2+ VIEW

[calcaneus axial]
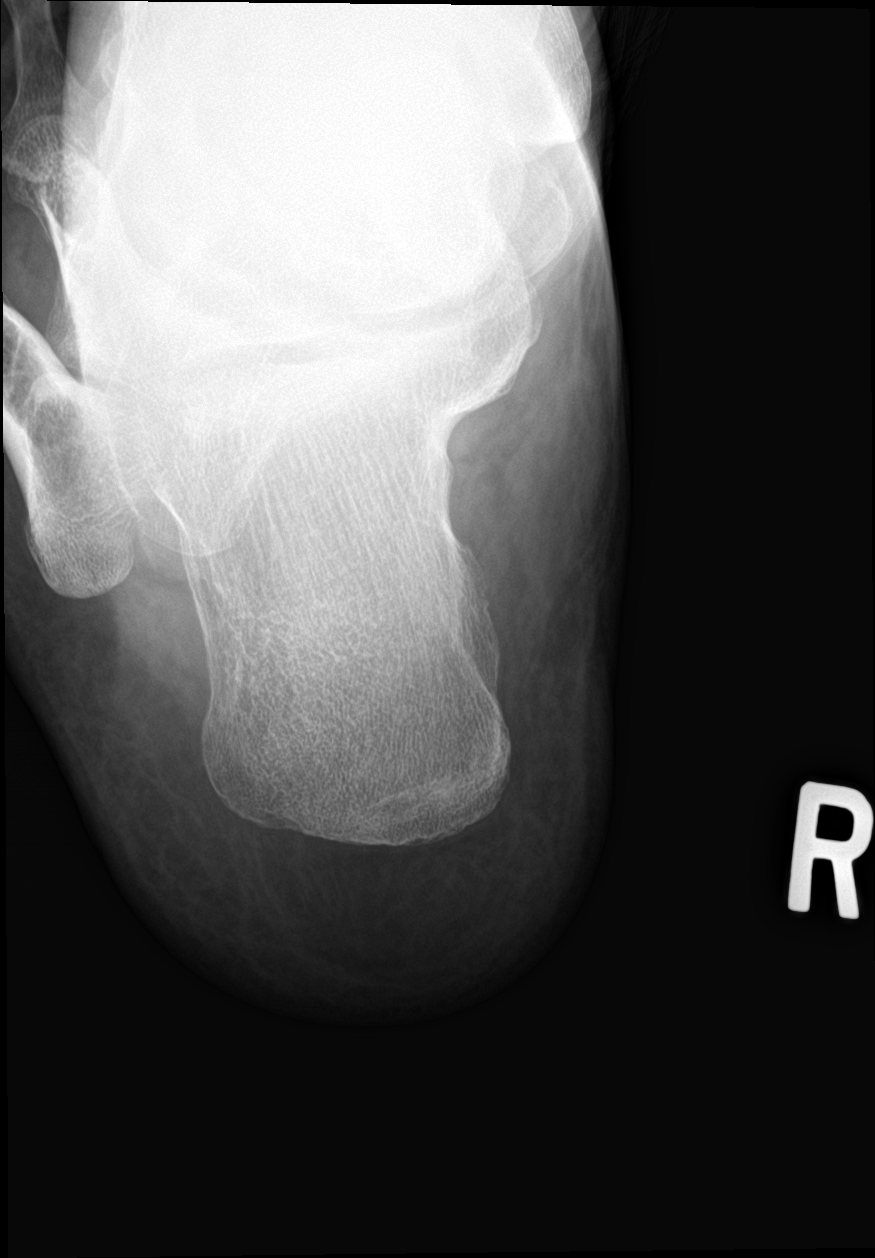

[calcaneus lat]
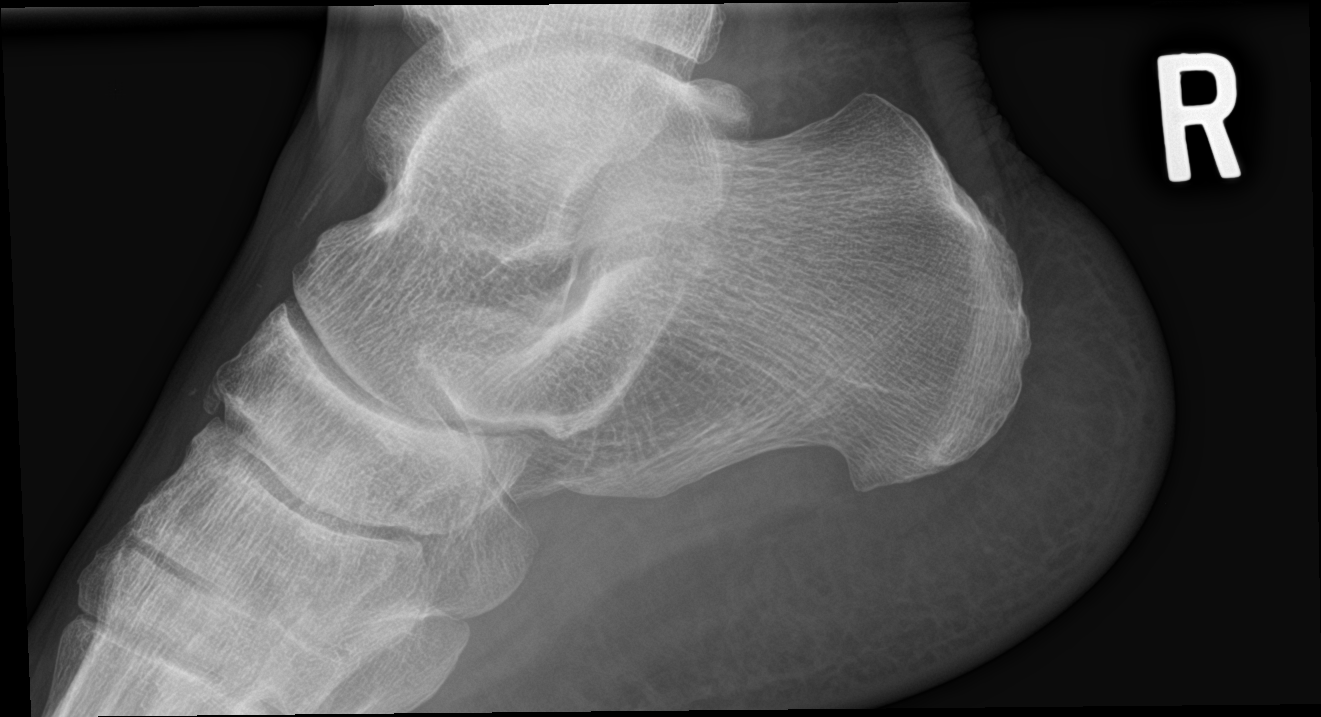

[2 of 2 positions shown; findings below may reference images not displayed]

FINDINGS: Minimal inferior and posterior calcaneal spur formation. Small
amount of arterial calcification. Mild dorsal tarsal spur formation.
IMPRESSION: Mild degenerative changes.

## 2020-03-13 IMAGING — DX DG FEMUR 2+V*L*
4 series · 4 of 4 positions shown · non-contrast
Comparison: None.

CLINICAL DATA: Left thigh pain after feeling a pop in his left hip
after twisting injury yesterday.

EXAM:
LEFT FEMUR 2 VIEWS

[femur ap (1 of 2)]
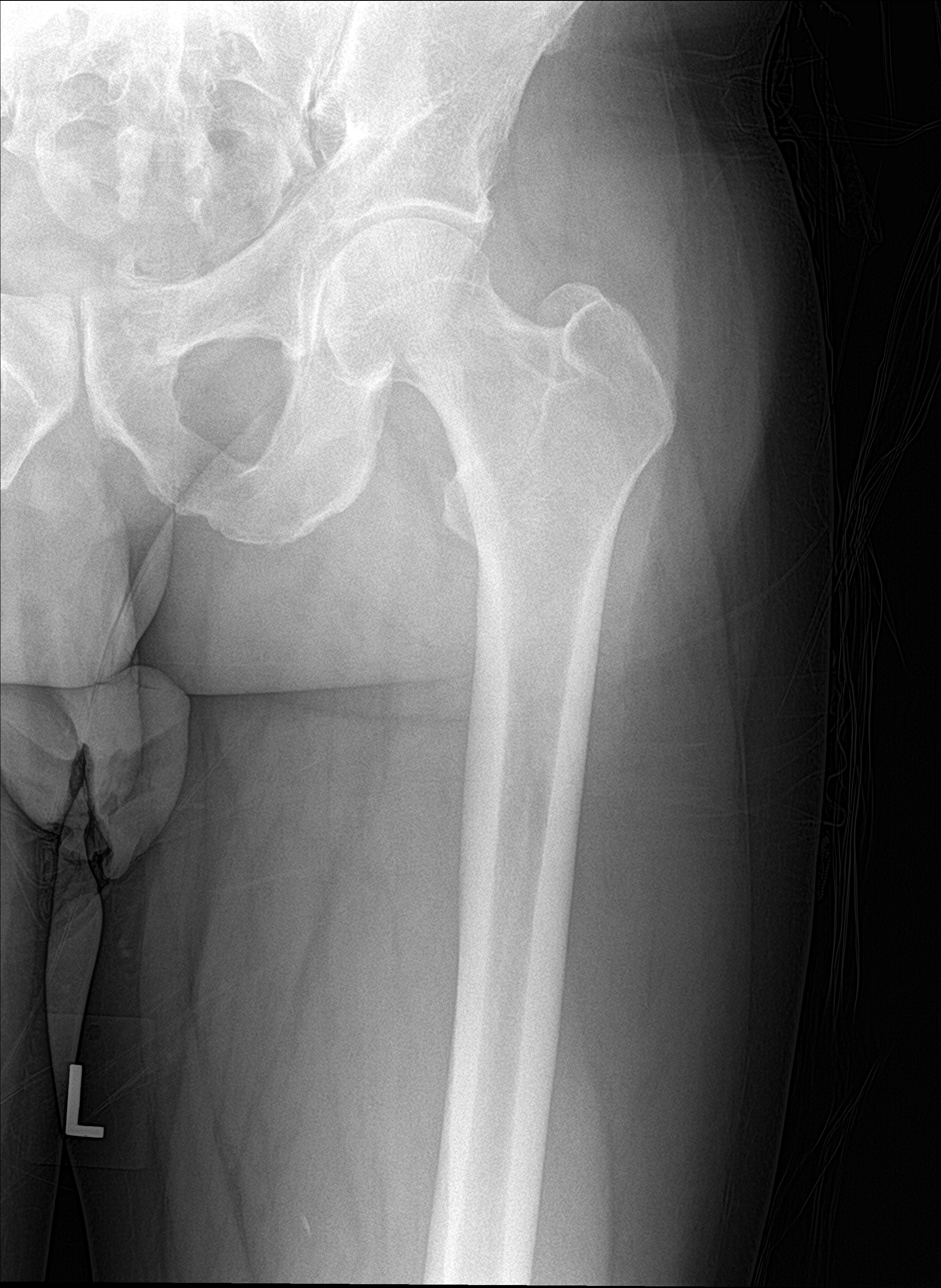

[femur lat (1 of 2)]
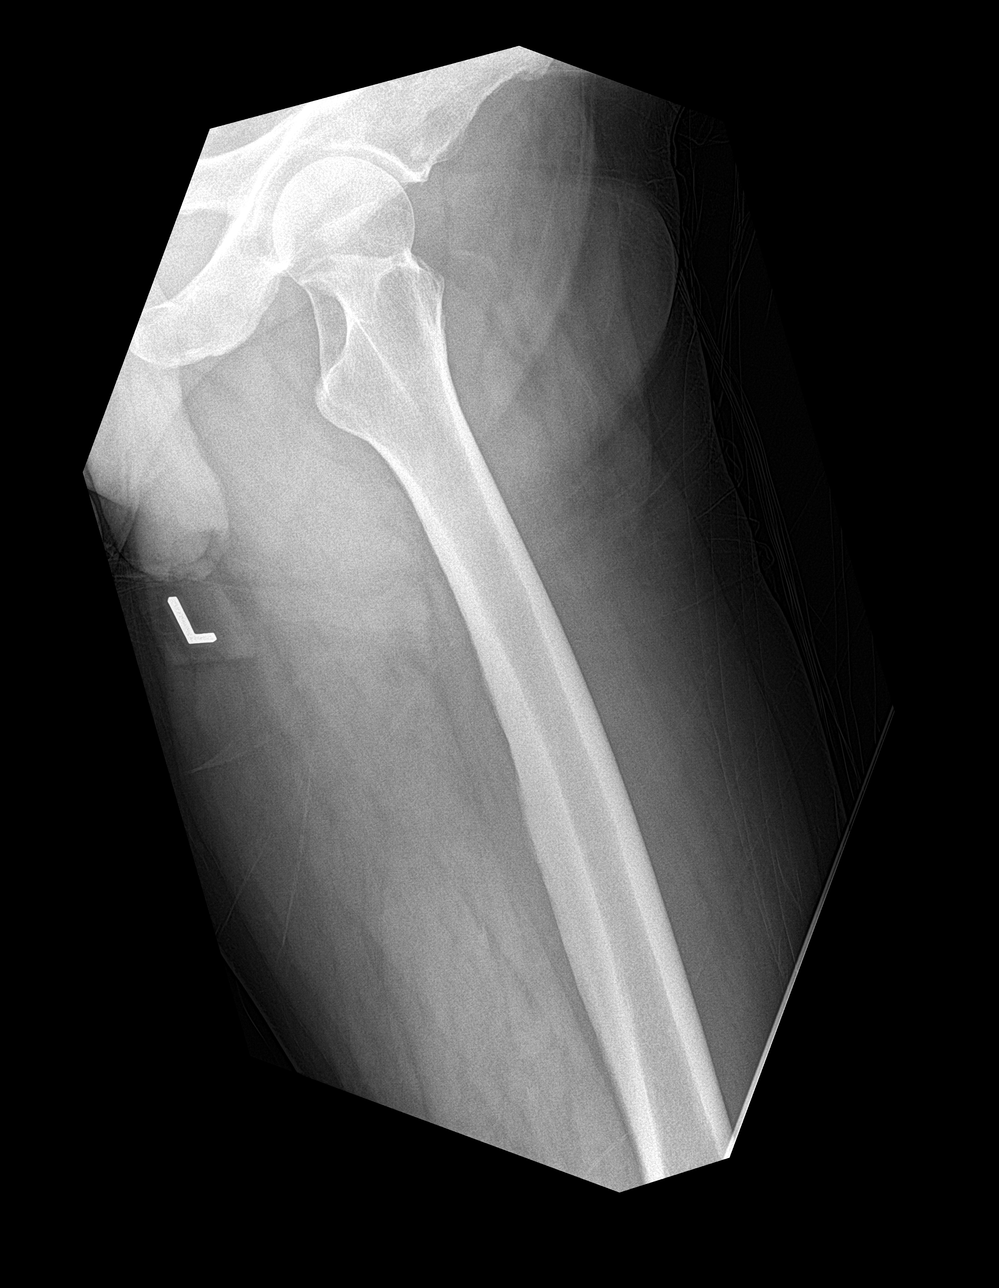

[femur lat (2 of 2)]
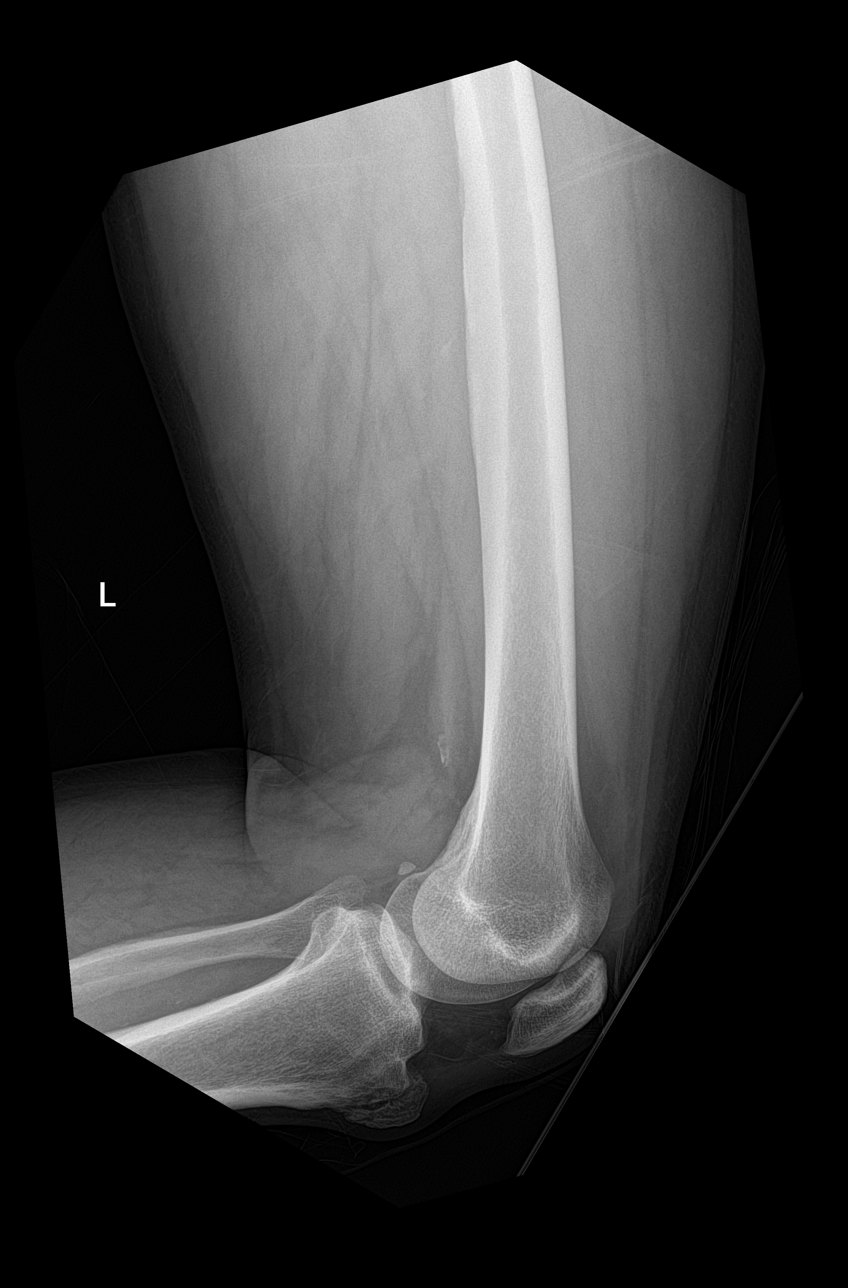

[femur ap (2 of 2)]
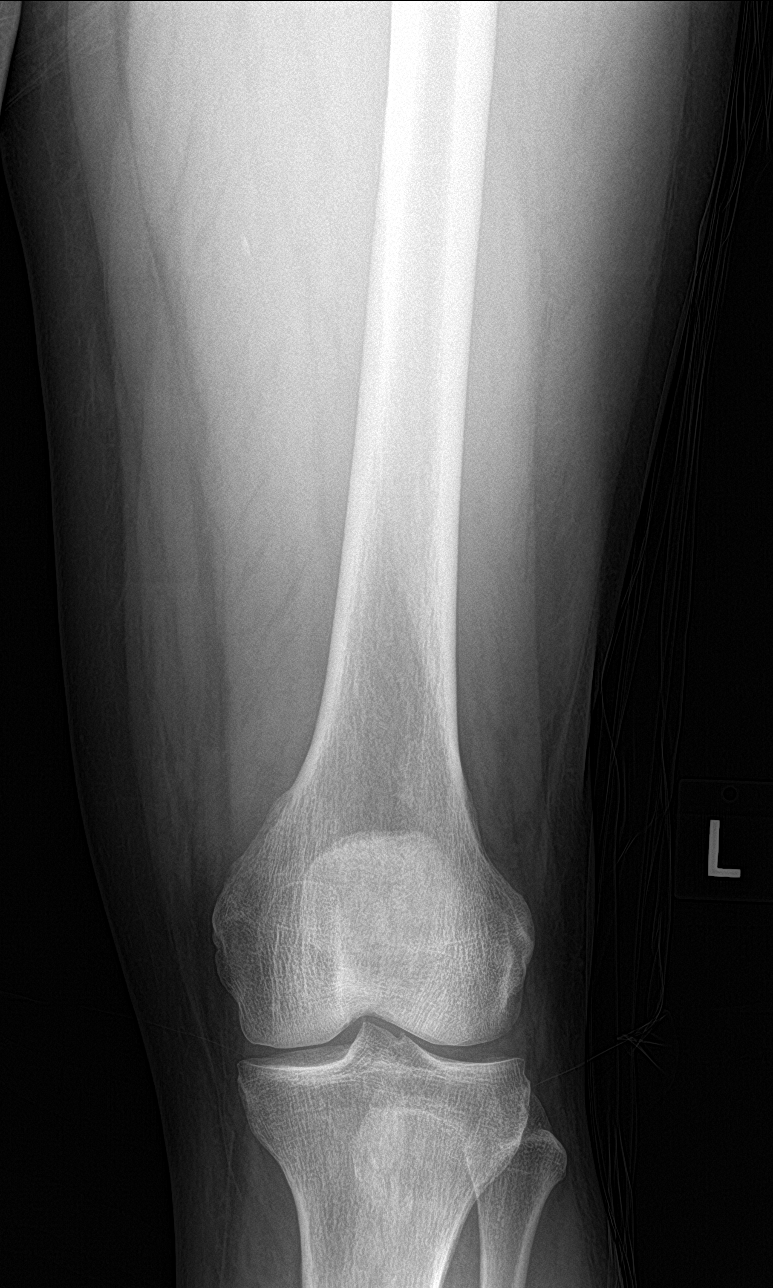

[4 of 4 positions shown; findings below may reference images not displayed]

FINDINGS: There is no evidence of fracture or other focal bone lesions.
Sequelae of prior Osgood-Schlatter disease. Soft tissues are
unremarkable.
IMPRESSION: Negative.

## 2020-05-15 DIAGNOSIS — Z03818 Encounter for observation for suspected exposure to other biological agents ruled out: Secondary | ICD-10-CM | POA: Diagnosis not present

## 2020-08-05 ENCOUNTER — Other Ambulatory Visit: Payer: Self-pay

## 2020-08-06 ENCOUNTER — Other Ambulatory Visit: Payer: Self-pay | Admitting: Internal Medicine

## 2020-08-06 ENCOUNTER — Encounter: Payer: Self-pay | Admitting: Internal Medicine

## 2020-08-06 ENCOUNTER — Ambulatory Visit (INDEPENDENT_AMBULATORY_CARE_PROVIDER_SITE_OTHER): Payer: BC Managed Care – PPO | Admitting: Internal Medicine

## 2020-08-06 VITALS — BP 140/90 | HR 78 | Temp 98.6°F | Ht 70.0 in | Wt 211.9 lb

## 2020-08-06 DIAGNOSIS — Z23 Encounter for immunization: Secondary | ICD-10-CM

## 2020-08-06 DIAGNOSIS — E559 Vitamin D deficiency, unspecified: Secondary | ICD-10-CM

## 2020-08-06 DIAGNOSIS — Z1211 Encounter for screening for malignant neoplasm of colon: Secondary | ICD-10-CM

## 2020-08-06 DIAGNOSIS — I10 Essential (primary) hypertension: Secondary | ICD-10-CM

## 2020-08-06 DIAGNOSIS — Z0000 Encounter for general adult medical examination without abnormal findings: Secondary | ICD-10-CM

## 2020-08-06 LAB — COMPREHENSIVE METABOLIC PANEL
ALT: 15 U/L (ref 0–53)
AST: 14 U/L (ref 0–37)
Albumin: 4.2 g/dL (ref 3.5–5.2)
Alkaline Phosphatase: 43 U/L (ref 39–117)
BUN: 13 mg/dL (ref 6–23)
CO2: 30 mEq/L (ref 19–32)
Calcium: 9.5 mg/dL (ref 8.4–10.5)
Chloride: 105 mEq/L (ref 96–112)
Creatinine, Ser: 1.08 mg/dL (ref 0.40–1.50)
GFR: 73.09 mL/min (ref >60.00)
Glucose, Bld: 101 mg/dL — ABNORMAL HIGH (ref 70–99)
Potassium: 4.3 mEq/L (ref 3.5–5.1)
Sodium: 142 mEq/L (ref 135–145)
Total Bilirubin: 0.6 mg/dL (ref 0.2–1.2)
Total Protein: 6.6 g/dL (ref 6.0–8.3)

## 2020-08-06 LAB — CBC WITH DIFFERENTIAL/PLATELET
Basophils Absolute: 0 10*3/uL (ref 0.0–0.1)
Basophils Relative: 0.8 % (ref 0.0–3.0)
Eosinophils Absolute: 0.3 10*3/uL (ref 0.0–0.7)
Eosinophils Relative: 7 % — ABNORMAL HIGH (ref 0.0–5.0)
HCT: 43.4 % (ref 39.0–52.0)
Hemoglobin: 15.3 g/dL (ref 13.0–17.0)
Lymphocytes Relative: 32.1 % (ref 12.0–46.0)
Lymphs Abs: 1.4 10*3/uL (ref 0.7–4.0)
MCHC: 35.1 g/dL (ref 30.0–36.0)
MCV: 87.5 fl (ref 78.0–100.0)
Monocytes Absolute: 0.7 10*3/uL (ref 0.1–1.0)
Monocytes Relative: 15.1 % — ABNORMAL HIGH (ref 3.0–12.0)
Neutro Abs: 2 10*3/uL (ref 1.4–7.7)
Neutrophils Relative %: 45 % (ref 43.0–77.0)
Platelets: 206 10*3/uL (ref 150.0–400.0)
RBC: 4.96 Mil/uL (ref 4.22–5.81)
RDW: 13.7 % (ref 11.5–15.5)
WBC: 4.4 10*3/uL (ref 4.0–10.5)

## 2020-08-06 LAB — LIPID PANEL
Cholesterol: 167 mg/dL (ref 0–200)
HDL: 52.4 mg/dL (ref >39.00)
LDL Cholesterol: 106 mg/dL — ABNORMAL HIGH (ref 0–99)
NonHDL: 114.36
Total CHOL/HDL Ratio: 3
Triglycerides: 41 mg/dL (ref 0.0–149.0)
VLDL: 8.2 mg/dL (ref 0.0–40.0)

## 2020-08-06 LAB — HEMOGLOBIN A1C: Hgb A1c MFr Bld: 5.6 % (ref 4.6–6.5)

## 2020-08-06 LAB — VITAMIN B12: Vitamin B-12: 319 pg/mL (ref 211–911)

## 2020-08-06 LAB — VITAMIN D 25 HYDROXY (VIT D DEFICIENCY, FRACTURES): VITD: 19.14 ng/mL — ABNORMAL LOW (ref 30.00–100.00)

## 2020-08-06 LAB — TSH: TSH: 0.9 u[IU]/mL (ref 0.35–4.50)

## 2020-08-06 MED ORDER — VITAMIN D (ERGOCALCIFEROL) 1.25 MG (50000 UNIT) PO CAPS
50000.0000 [IU] | ORAL_CAPSULE | ORAL | 0 refills | Status: DC
Start: 2020-08-06 — End: 2020-10-26

## 2020-08-06 NOTE — Progress Notes (Signed)
Established Patient Office Visit     This visit occurred during the SARS-CoV-2 public health emergency.  Safety protocols were in place, including screening questions prior to the visit, additional usage of staff PPE, and extensive cleaning of exam room while observing appropriate contact time as indicated for disinfecting solutions.    CC/Reason for Visit: Annual preventive exam  HPI: Jonathan Marks is a 65 y.o. male who is coming in today for the above mentioned reasons. Past Medical History is significant for: Hypertension, vitamin D deficiency and mild obesity.  Unfortunately his wife passed away a couple months ago.  He is due for his COVID booster, shingles vaccination series and Tdap.  He is also due for his colonoscopy.  He has routine dental care but no eye care.  He does not exercise routinely.  He has no acute complaints today.   Past Medical/Surgical History: Past Medical History:  Diagnosis Date  . Prostatitis   . Unspecified essential hypertension 01/21/2007    Past Surgical History:  Procedure Laterality Date  .  left plantar fasciotomy  October 2012  . COLONOSCOPY     age 80  . HERNIA REPAIR    . INGUINAL HERNIA REPAIR  2001   left , Dr.Weatherly  . WISDOM TOOTH EXTRACTION      Social History:  reports that he has quit smoking. He has never used smokeless tobacco. He reports that he does not drink alcohol and does not use drugs.  Allergies: No Known Allergies  Family History:  Family History  Problem Relation Age of Onset  . Hypertension Mother   . Alcohol abuse Father   . Stroke Father   . Pancreatic cancer Sister   . Alcohol abuse Other   . Hypertension Other   . Colon cancer Neg Hx   . Esophageal cancer Neg Hx   . Prostate cancer Neg Hx   . Rectal cancer Neg Hx   . Stomach cancer Neg Hx      Current Outpatient Medications:  .  benazepril-hydrochlorthiazide (LOTENSIN HCT) 20-12.5 MG tablet, TAKE 1 TABLET BY MOUTH EVERY DAY, Disp: 90  tablet, Rfl: 1  Review of Systems:  Constitutional: Denies fever, chills, diaphoresis, appetite change and fatigue.  HEENT: Denies photophobia, eye pain, redness, hearing loss, ear pain, congestion, sore throat, rhinorrhea, sneezing, mouth sores, trouble swallowing, neck pain, neck stiffness and tinnitus.   Respiratory: Denies SOB, DOE, cough, chest tightness,  and wheezing.   Cardiovascular: Denies chest pain, palpitations and leg swelling.  Gastrointestinal: Denies nausea, vomiting, abdominal pain, diarrhea, constipation, blood in stool and abdominal distention.  Genitourinary: Denies dysuria, urgency, frequency, hematuria, flank pain and difficulty urinating.  Endocrine: Denies: hot or cold intolerance, sweats, changes in hair or nails, polyuria, polydipsia. Musculoskeletal: Denies myalgias, back pain, joint swelling, arthralgias and gait problem.  Skin: Denies pallor, rash and wound.  Neurological: Denies dizziness, seizures, syncope, weakness, light-headedness, numbness and headaches.  Hematological: Denies adenopathy. Easy bruising, personal or family bleeding history  Psychiatric/Behavioral: Denies suicidal ideation, mood changes, confusion, nervousness, sleep disturbance and agitation    Physical Exam: Vitals:   08/06/20 1017  BP: 140/90  Pulse: 78  Temp: 98.6 F (37 C)  TempSrc: Oral  SpO2: 98%  Weight: 211 lb 14.4 oz (96.1 kg)  Height: 5\' 10"  (1.778 m)    Body mass index is 30.4 kg/m.   Constitutional: NAD, calm, comfortable Eyes: PERRL, lids and conjunctivae normal ENMT: Mucous membranes are moist. Posterior pharynx clear of any exudate  or lesions. Normal dentition. Tympanic membrane is pearly white, no erythema or bulging. Neck: normal, supple, no masses, no thyromegaly Respiratory: clear to auscultation bilaterally, no wheezing, no crackles. Normal respiratory effort. No accessory muscle use.  Cardiovascular: Regular rate and rhythm, no murmurs / rubs / gallops.  No extremity edema. 2+ pedal pulses. Abdomen: no tenderness, no masses palpated. No hepatosplenomegaly. Bowel sounds positive.  Musculoskeletal: no clubbing / cyanosis. No joint deformity upper and lower extremities. Good ROM, no contractures. Normal muscle tone.  Skin: no rashes, lesions, ulcers. No induration Neurologic: CN 2-12 grossly intact. Sensation intact, DTR normal. Strength 5/5 in all 4.  Psychiatric: Normal judgment and insight. Alert and oriented x 3. Normal mood.    Impression and Plan:  Encounter for preventive health examination -Advised routine eye and dental care. -Tetanus and shingles updated today, he is also due for his COVID booster that he will obtain from the pharmacy. -Screening labs today. -Healthy lifestyle discussed in detail. -Refer to GI for screening colonoscopy.  Screening for malignant neoplasm of colon  - Plan: Ambulatory referral to Gastroenterology  Essential hypertension  - Plan: CBC with Differential/Platelet, Comprehensive metabolic panel, Lipid panel -Blood pressure is elevated today. -He states he is compliant with his lisinopril/hydrochlorothiazide. -He will do ambulatory measurements and return in 8 weeks for follow-up.  Vitamin D deficiency  - Plan: VITAMIN D 25 Hydroxy (Vit-D Deficiency, Fractures)  Need for Tdap vaccination -Tdap administered today.  Need for shingles vaccine -For shingles vaccine administered today.   Patient Instructions   -Nice seeing you today!!  -Lab work today; will notify you once results are available.  -tetanus and shingles vaccines today.  -Remember to schedule your COVID booster.  -Check your blood pressure 2-3 times a week and bring your measurements into your next visit.  -Schedule follow up in 8 weeks for your blood pressure.   Preventive Care 4-97 Years Old, Male Preventive care refers to lifestyle choices and visits with your health care provider that can promote health and wellness.  This includes:  A yearly physical exam. This is also called an annual wellness visit.  Regular dental and eye exams.  Immunizations.  Screening for certain conditions.  Healthy lifestyle choices, such as: ? Eating a healthy diet. ? Getting regular exercise. ? Not using drugs or products that contain nicotine and tobacco. ? Limiting alcohol use. What can I expect for my preventive care visit? Physical exam Your health care provider will check your:  Height and weight. These may be used to calculate your BMI (body mass index). BMI is a measurement that tells if you are at a healthy weight.  Heart rate and blood pressure.  Body temperature.  Skin for abnormal spots. Counseling Your health care provider may ask you questions about your:  Past medical problems.  Family's medical history.  Alcohol, tobacco, and drug use.  Emotional well-being.  Home life and relationship well-being.  Sexual activity.  Diet, exercise, and sleep habits.  Work and work Statistician.  Access to firearms. What immunizations do I need? Vaccines are usually given at various ages, according to a schedule. Your health care provider will recommend vaccines for you based on your age, medical history, and lifestyle or other factors, such as travel or where you work.   What tests do I need? Blood tests  Lipid and cholesterol levels. These may be checked every 5 years, or more often if you are over 57 years old.  Hepatitis C test.  Hepatitis B test.  Screening  Lung cancer screening. You may have this screening every year starting at age 25 if you have a 30-pack-year history of smoking and currently smoke or have quit within the past 15 years.  Prostate cancer screening. Recommendations will vary depending on your family history and other risks.  Genital exam to check for testicular cancer or hernias.  Colorectal cancer screening. ? All adults should have this screening starting at age 48  and continuing until age 17. ? Your health care provider may recommend screening at age 16 if you are at increased risk. ? You will have tests every 1-10 years, depending on your results and the type of screening test.  Diabetes screening. ? This is done by checking your blood sugar (glucose) after you have not eaten for a while (fasting). ? You may have this done every 1-3 years.  STD (sexually transmitted disease) testing, if you are at risk. Follow these instructions at home: Eating and drinking  Eat a diet that includes fresh fruits and vegetables, whole grains, lean protein, and low-fat dairy products.  Take vitamin and mineral supplements as recommended by your health care provider.  Do not drink alcohol if your health care provider tells you not to drink.  If you drink alcohol: ? Limit how much you have to 0-2 drinks a day. ? Be aware of how much alcohol is in your drink. In the [place] one drink equals one 12 oz bottle of beer (355 mL), one 5 oz glass of wine (148 mL), or one 1 oz glass of hard liquor (44 mL).   Lifestyle  Take daily care of your teeth and gums. Brush your teeth every morning and night with fluoride toothpaste. Floss one time each day.  Stay active. Exercise for at least 30 minutes 5 or more days each week.  Do not use any products that contain nicotine or tobacco, such as cigarettes, e-cigarettes, and chewing tobacco. If you need help quitting, ask your health care provider.  Do not use drugs.  If you are sexually active, practice safe sex. Use a condom or other form of protection to prevent STIs (sexually transmitted infections).  If told by your health care provider, take low-dose aspirin daily starting at age 65.  Find healthy ways to cope with stress, such as: ? Meditation, yoga, or listening to music. ? Journaling. ? Talking to a trusted person. ? Spending time with friends and family. Safety  Always wear your seat belt while driving or riding  in a vehicle.  Do not drive: ? If you have been drinking alcohol. Do not ride with someone who has been drinking. ? When you are tired or distracted. ? While texting.  Wear a helmet and other protective equipment during sports activities.  If you have firearms in your house, make sure you follow all gun safety procedures. What's next?  Go to your health care provider once a year for an annual wellness visit.  Ask your health care provider how often you should have your eyes and teeth checked.  Stay up to date on all vaccines. This information is not intended to replace advice given to you by your health care provider. Make sure you discuss any questions you have with your health care provider. Document Revised: 01/21/2019 Document Reviewed: 04/18/2018 Elsevier Patient Education  2021 [place], MD [facility]

## 2020-08-06 NOTE — Patient Instructions (Signed)
-Nice seeing you today!!  -Lab work today; will notify you once results are available.  -tetanus and shingles vaccines today.  -Remember to schedule your COVID booster.  -Check your blood pressure 2-3 times a week and bring your measurements into your next visit.  -Schedule follow up in 8 weeks for your blood pressure.   Preventive Care 30-64 Years Old, Male Preventive care refers to lifestyle choices and visits with your health care provider that can promote health and wellness. This includes:  A yearly physical exam. This is also called an annual wellness visit.  Regular dental and eye exams.  Immunizations.  Screening for certain conditions.  Healthy lifestyle choices, such as: ? Eating a healthy diet. ? Getting regular exercise. ? Not using drugs or products that contain nicotine and tobacco. ? Limiting alcohol use. What can I expect for my preventive care visit? Physical exam Your health care provider will check your:  Height and weight. These may be used to calculate your BMI (body mass index). BMI is a measurement that tells if you are at a healthy weight.  Heart rate and blood pressure.  Body temperature.  Skin for abnormal spots. Counseling Your health care provider may ask you questions about your:  Past medical problems.  Family's medical history.  Alcohol, tobacco, and drug use.  Emotional well-being.  Home life and relationship well-being.  Sexual activity.  Diet, exercise, and sleep habits.  Work and work Statistician.  Access to firearms. What immunizations do I need? Vaccines are usually given at various ages, according to a schedule. Your health care provider will recommend vaccines for you based on your age, medical history, and lifestyle or other factors, such as travel or where you work.   What tests do I need? Blood tests  Lipid and cholesterol levels. These may be checked every 5 years, or more often if you are over 57 years  old.  Hepatitis C test.  Hepatitis B test. Screening  Lung cancer screening. You may have this screening every year starting at age 24 if you have a 30-pack-year history of smoking and currently smoke or have quit within the past 15 years.  Prostate cancer screening. Recommendations will vary depending on your family history and other risks.  Genital exam to check for testicular cancer or hernias.  Colorectal cancer screening. ? All adults should have this screening starting at age 16 and continuing until age 39. ? Your health care provider may recommend screening at age 71 if you are at increased risk. ? You will have tests every 1-10 years, depending on your results and the type of screening test.  Diabetes screening. ? This is done by checking your blood sugar (glucose) after you have not eaten for a while (fasting). ? You may have this done every 1-3 years.  STD (sexually transmitted disease) testing, if you are at risk. Follow these instructions at home: Eating and drinking  Eat a diet that includes fresh fruits and vegetables, whole grains, lean protein, and low-fat dairy products.  Take vitamin and mineral supplements as recommended by your health care provider.  Do not drink alcohol if your health care provider tells you not to drink.  If you drink alcohol: ? Limit how much you have to 0-2 drinks a day. ? Be aware of how much alcohol is in your drink. In the [place] one drink equals one 12 oz bottle of beer (355 mL), one 5 oz glass of wine (148 mL), or one 1 oz  glass of hard liquor (44 mL).   Lifestyle  Take daily care of your teeth and gums. Brush your teeth every morning and night with fluoride toothpaste. Floss one time each day.  Stay active. Exercise for at least 30 minutes 5 or more days each week.  Do not use any products that contain nicotine or tobacco, such as cigarettes, e-cigarettes, and chewing tobacco. If you need help quitting, ask your health care  provider.  Do not use drugs.  If you are sexually active, practice safe sex. Use a condom or other form of protection to prevent STIs (sexually transmitted infections).  If told by your health care provider, take low-dose aspirin daily starting at age 63.  Find healthy ways to cope with stress, such as: ? Meditation, yoga, or listening to music. ? Journaling. ? Talking to a trusted person. ? Spending time with friends and family. Safety  Always wear your seat belt while driving or riding in a vehicle.  Do not drive: ? If you have been drinking alcohol. Do not ride with someone who has been drinking. ? When you are tired or distracted. ? While texting.  Wear a helmet and other protective equipment during sports activities.  If you have firearms in your house, make sure you follow all gun safety procedures. What's next?  Go to your health care provider once a year for an annual wellness visit.  Ask your health care provider how often you should have your eyes and teeth checked.  Stay up to date on all vaccines. This information is not intended to replace advice given to you by your health care provider. Make sure you discuss any questions you have with your health care provider. Document Revised: 01/21/2019 Document Reviewed: 04/18/2018 Elsevier Patient Education  2021 [employer].

## 2020-08-06 NOTE — Addendum Note (Signed)
Addended by: Westley Hummer B on: 08/06/2020 11:51 AM   Modules accepted: Orders

## 2020-08-10 ENCOUNTER — Other Ambulatory Visit: Payer: Self-pay | Admitting: Internal Medicine

## 2020-08-10 DIAGNOSIS — E559 Vitamin D deficiency, unspecified: Secondary | ICD-10-CM

## 2020-09-17 ENCOUNTER — Ambulatory Visit: Payer: BC Managed Care – PPO

## 2020-09-24 ENCOUNTER — Other Ambulatory Visit: Payer: Self-pay

## 2020-09-24 ENCOUNTER — Ambulatory Visit: Payer: BC Managed Care – PPO

## 2020-10-01 ENCOUNTER — Encounter: Payer: BC Managed Care – PPO | Admitting: Internal Medicine

## 2020-10-01 ENCOUNTER — Ambulatory Visit: Payer: BC Managed Care – PPO | Admitting: Internal Medicine

## 2020-10-08 ENCOUNTER — Ambulatory Visit (INDEPENDENT_AMBULATORY_CARE_PROVIDER_SITE_OTHER): Payer: BC Managed Care – PPO | Admitting: Internal Medicine

## 2020-10-08 ENCOUNTER — Other Ambulatory Visit: Payer: Self-pay

## 2020-10-08 ENCOUNTER — Encounter: Payer: Self-pay | Admitting: Internal Medicine

## 2020-10-08 VITALS — BP 110/80 | HR 88 | Temp 98.0°F | Wt 217.8 lb

## 2020-10-08 DIAGNOSIS — Z23 Encounter for immunization: Secondary | ICD-10-CM | POA: Diagnosis not present

## 2020-10-08 DIAGNOSIS — I10 Essential (primary) hypertension: Secondary | ICD-10-CM | POA: Diagnosis not present

## 2020-10-08 NOTE — Patient Instructions (Signed)
-  Nice seeing you today!!  -Second shingles vaccine today.  -Remember your COVID boosters at the pharmacy.  -Schedule follow up in 6 months.

## 2020-10-08 NOTE — Progress Notes (Signed)
Established Patient Office Visit     This visit occurred during the SARS-CoV-2 public health emergency.  Safety protocols were in place, including screening questions prior to the visit, additional usage of staff PPE, and extensive cleaning of exam room while observing appropriate contact time as indicated for disinfecting solutions.    CC/Reason for Visit: Blood pressure follow-up, shingles vaccine  HPI: Jonathan Marks is a 65 y.o. male who is coming in today for the above mentioned reasons.  He is here today mainly for: Follow-up during history of recurrent elbow his blood pressure was found to be 140/90 and he was asked to do ambulatory monitoring and return today.  Unfortunately he has not been checking his blood pressure, however in the office today it is 110/80.  He is on benazepril/hydrochlorothiazide.  He is due for his second shingles vaccine.  He is otherwise doing well and has no complaints.  He has only had 2 COVID vaccines, is in need of booster doses.  Past Medical/Surgical History: Past Medical History:  Diagnosis Date  . Prostatitis   . Unspecified essential hypertension 01/21/2007    Past Surgical History:  Procedure Laterality Date  .  left plantar fasciotomy  October 2012  . COLONOSCOPY     age 18  . HERNIA REPAIR    . INGUINAL HERNIA REPAIR  2001   left , Dr.Weatherly  . WISDOM TOOTH EXTRACTION      Social History:  reports that he has quit smoking. He has never used smokeless tobacco. He reports that he does not drink alcohol and does not use drugs.  Allergies: No Known Allergies  Family History:  Family History  Problem Relation Age of Onset  . Hypertension Mother   . Alcohol abuse Father   . Stroke Father   . Pancreatic cancer Sister   . Alcohol abuse Other   . Hypertension Other   . Colon cancer Neg Hx   . Esophageal cancer Neg Hx   . Prostate cancer Neg Hx   . Rectal cancer Neg Hx   . Stomach cancer Neg Hx      Current  Outpatient Medications:  .  benazepril-hydrochlorthiazide (LOTENSIN HCT) 20-12.5 MG tablet, TAKE 1 TABLET BY MOUTH EVERY DAY, Disp: 90 tablet, Rfl: 1 .  Vitamin D, Ergocalciferol, (DRISDOL) 1.25 MG (50000 UNIT) CAPS capsule, Take 1 capsule (50,000 Units total) by mouth every 7 (seven) days for 12 doses., Disp: 12 capsule, Rfl: 0  Review of Systems:  Constitutional: Denies fever, chills, diaphoresis, appetite change and fatigue.  HEENT: Denies photophobia, eye pain, redness, hearing loss, ear pain, congestion, sore throat, rhinorrhea, sneezing, mouth sores, trouble swallowing, neck pain, neck stiffness and tinnitus.   Respiratory: Denies SOB, DOE, cough, chest tightness,  and wheezing.   Cardiovascular: Denies chest pain, palpitations and leg swelling.  Gastrointestinal: Denies nausea, vomiting, abdominal pain, diarrhea, constipation, blood in stool and abdominal distention.  Genitourinary: Denies dysuria, urgency, frequency, hematuria, flank pain and difficulty urinating.  Endocrine: Denies: hot or cold intolerance, sweats, changes in hair or nails, polyuria, polydipsia. Musculoskeletal: Denies myalgias, back pain, joint swelling, arthralgias and gait problem.  Skin: Denies pallor, rash and wound.  Neurological: Denies dizziness, seizures, syncope, weakness, light-headedness, numbness and headaches.  Hematological: Denies adenopathy. Easy bruising, personal or family bleeding history  Psychiatric/Behavioral: Denies suicidal ideation, mood changes, confusion, nervousness, sleep disturbance and agitation    Physical Exam: Vitals:   10/08/20 1035  BP: 110/80  Pulse: 88  Temp: 98 F (  36.7 C)  TempSrc: Oral  SpO2: 97%  Weight: 217 lb 12.8 oz (98.8 kg)    Body mass index is 31.25 kg/m.   Constitutional: NAD, calm, comfortable Eyes: PERRL, lids and conjunctivae normal, wears corrective lenses ENMT: Mucous membranes are moist.  Respiratory: clear to auscultation bilaterally, no  wheezing, no crackles. Normal respiratory effort. No accessory muscle use.  Cardiovascular: Regular rate and rhythm, no murmurs / rubs / gallops. No extremity edema.  Neurologic: Grossly intact and nonfocal Psychiatric: Normal judgment and insight. Alert and oriented x 3. Normal mood.    Impression and Plan:  Essential hypertension -Blood pressure is well controlled, continue current medication.  Need for shingles vaccine -Second shingles vaccine today.    Patient Instructions  -Nice seeing you today!!  -Second shingles vaccine today.  -Remember your COVID boosters at the pharmacy.  -Schedule follow up in 6 months.      Lelon Frohlich, MD [facility]

## 2020-10-08 NOTE — Addendum Note (Signed)
Addended by: Westley Hummer B on: 10/08/2020 12:03 PM   Modules accepted: Orders

## 2020-10-24 ENCOUNTER — Other Ambulatory Visit: Payer: Self-pay | Admitting: Internal Medicine

## 2020-10-24 DIAGNOSIS — E559 Vitamin D deficiency, unspecified: Secondary | ICD-10-CM

## 2021-01-11 ENCOUNTER — Other Ambulatory Visit: Payer: Self-pay | Admitting: Internal Medicine

## 2021-01-11 DIAGNOSIS — E559 Vitamin D deficiency, unspecified: Secondary | ICD-10-CM

## 2021-01-27 ENCOUNTER — Emergency Department (HOSPITAL_COMMUNITY): Payer: 59

## 2021-01-27 ENCOUNTER — Other Ambulatory Visit: Payer: Self-pay

## 2021-01-27 ENCOUNTER — Encounter (HOSPITAL_COMMUNITY): Payer: Self-pay

## 2021-01-27 ENCOUNTER — Emergency Department (HOSPITAL_COMMUNITY)
Admission: EM | Admit: 2021-01-27 | Discharge: 2021-01-27 | Disposition: A | Discharge status: Home / Self Care | Payer: 59 | Attending: Emergency Medicine | Admitting: Emergency Medicine

## 2021-01-27 DIAGNOSIS — R202 Paresthesia of skin: Secondary | ICD-10-CM | POA: Diagnosis not present

## 2021-01-27 DIAGNOSIS — Z79899 Other long term (current) drug therapy: Secondary | ICD-10-CM | POA: Diagnosis not present

## 2021-01-27 DIAGNOSIS — R030 Elevated blood-pressure reading, without diagnosis of hypertension: Secondary | ICD-10-CM | POA: Diagnosis present

## 2021-01-27 DIAGNOSIS — Z87891 Personal history of nicotine dependence: Secondary | ICD-10-CM | POA: Insufficient documentation

## 2021-01-27 DIAGNOSIS — R519 Headache, unspecified: Secondary | ICD-10-CM | POA: Diagnosis not present

## 2021-01-27 DIAGNOSIS — R200 Anesthesia of skin: Secondary | ICD-10-CM

## 2021-01-27 DIAGNOSIS — I10 Essential (primary) hypertension: Secondary | ICD-10-CM | POA: Insufficient documentation

## 2021-01-27 LAB — COMPREHENSIVE METABOLIC PANEL
ALT: 31 U/L (ref 0–44)
AST: 20 U/L (ref 15–41)
Albumin: 4.4 g/dL (ref 3.5–5.0)
Alkaline Phosphatase: 46 U/L (ref 38–126)
Anion gap: 8 (ref 5–15)
BUN: 11 mg/dL (ref 8–23)
CO2: 27 mmol/L (ref 22–32)
Calcium: 9.1 mg/dL (ref 8.9–10.3)
Chloride: 104 mmol/L (ref 98–111)
Creatinine, Ser: 0.96 mg/dL (ref 0.61–1.24)
GFR, Estimated: >60 mL/min (ref >60)
Glucose, Bld: 101 mg/dL — ABNORMAL HIGH (ref 70–99)
Potassium: 3.6 mmol/L (ref 3.5–5.1)
Sodium: 139 mmol/L (ref 135–145)
Total Bilirubin: 0.9 mg/dL (ref 0.3–1.2)
Total Protein: 7 g/dL (ref 6.5–8.1)

## 2021-01-27 LAB — TROPONIN I (HIGH SENSITIVITY)
Troponin I (High Sensitivity): 5 ng/L (ref <18)
Troponin I (High Sensitivity): 5 ng/L (ref <18)

## 2021-01-27 LAB — CBC WITH DIFFERENTIAL/PLATELET
Abs Immature Granulocytes: 0.02 10*3/uL (ref 0.00–0.07)
Basophils Absolute: 0 10*3/uL (ref 0.0–0.1)
Basophils Relative: 1 %
Eosinophils Absolute: 0.4 10*3/uL (ref 0.0–0.5)
Eosinophils Relative: 7 %
HCT: 43.7 % (ref 39.0–52.0)
Hemoglobin: 16.1 g/dL (ref 13.0–17.0)
Immature Granulocytes: 0 %
Lymphocytes Relative: 28 %
Lymphs Abs: 1.5 10*3/uL (ref 0.7–4.0)
MCH: 31 pg (ref 26.0–34.0)
MCHC: 36.8 g/dL — ABNORMAL HIGH (ref 30.0–36.0)
MCV: 84.2 fL (ref 80.0–100.0)
Monocytes Absolute: 0.8 10*3/uL (ref 0.1–1.0)
Monocytes Relative: 15 %
Neutro Abs: 2.5 10*3/uL (ref 1.7–7.7)
Neutrophils Relative %: 49 %
Platelets: 218 10*3/uL (ref 150–400)
RBC: 5.19 MIL/uL (ref 4.22–5.81)
RDW: 12.6 % (ref 11.5–15.5)
WBC: 5.2 10*3/uL (ref 4.0–10.5)
nRBC: 0 % (ref 0.0–0.2)

## 2021-01-27 MED ORDER — HYDROCHLOROTHIAZIDE 12.5 MG PO TABS: 12.5000 mg | ORAL_TABLET | Freq: Every day | ORAL | 0 refills | Status: DC

## 2021-01-27 MED ORDER — HYDROCHLOROTHIAZIDE 12.5 MG PO CAPS
12.5000 mg | ORAL_CAPSULE | Freq: Once | ORAL | Status: AC
  Administered 2021-01-27: 12.5 mg via ORAL
  Filled 2021-01-27: qty 1

## 2021-01-27 NOTE — ED Provider Notes (Signed)
[facility] DEPT Provider Note   CSN: [id number] Arrival date & time: 01/27/21  1020     History Chief Complaint  Patient presents with   Hypertension    Jonathan Marks is a 65 y.o. male.  HPI     Forearm down to fingers felt numb 930PM last night, arm felt tight and fingers felt numb, slight headache and dizziness last night 824 systolic BP last night Last night had right arm numbness that came and went away Lasted about 30 minutes or less. Was sitting up in chair, dozed off and then happened.   Arm numbness was just last night Feels lightheaded, when goin gfrom sitting to standing Denies weakness, difficulty talking or walking, visual changes or facial droop.  No chest pain or dyspnea, sometimes after eating will have discomfort but not today Slight headache No nausea or vomiting, took an advil last night before bed Dental implants Has not missed any medicine, BP usually controlled   Past Medical History:  Diagnosis Date   Prostatitis    Unspecified essential hypertension 01/21/2007    Patient Active Problem List   Diagnosis Date Noted   Vitamin D deficiency 12/17/2018   Obesity (BMI 30-39.9) 07/19/2018   Health maintenance examination 06/10/2010   Essential hypertension 01/21/2007    Past Surgical History:  Procedure Laterality Date    left plantar fasciotomy  October 2012   COLONOSCOPY     age 78   [place]  2001   left , Dr.Weatherly   WISDOM TOOTH EXTRACTION         Family History  Problem Relation Age of Onset   Hypertension Mother    Alcohol abuse Father    Stroke Father    Pancreatic cancer Sister    Alcohol abuse Other    Hypertension Other    Colon cancer Neg Hx    Esophageal cancer Neg Hx    Prostate cancer Neg Hx    Rectal cancer Neg Hx    Stomach cancer Neg Hx     Social History   Tobacco Use   Smoking status: Former   Smokeless tobacco: Never  Brewing technologist Use: Never used  Substance Use Topics   Alcohol use: No   Drug use: No    Home Medications Prior to Admission medications   Medication Sig Start Date End Date Taking? Authorizing Provider  benazepril-hydrochlorthiazide (LOTENSIN HCT) 20-12.5 MG tablet TAKE 1 TABLET BY MOUTH EVERY DAY 08/06/20   Isaac Bliss, Rayford Halsted, MD    Allergies    Patient has no known allergies.  Review of Systems   Review of Systems  Constitutional:  Negative for fever.  Eyes:  Negative for visual disturbance.  Respiratory:  Negative for shortness of breath.   Cardiovascular:  Negative for chest pain.  Gastrointestinal:  Negative for abdominal pain, nausea and vomiting.  Genitourinary:  Negative for difficulty urinating.  Musculoskeletal:  Negative for back pain and neck stiffness. Arthralgias: tightness in right arm. Skin:  Negative for rash.  Neurological:  Positive for light-headedness, numbness and headaches. Negative for dizziness, syncope, facial asymmetry, speech difficulty and weakness.   Physical Exam Updated Vital Signs BP (!) 189/88   Pulse 74   Temp 98.2 F (36.8 C) (Oral)   Resp 18   Ht 5\' 10"  (1.778 m)   Wt 97.5 kg   SpO2 98%   BMI 30.85 kg/m   Physical Exam Vitals and  nursing note reviewed.  Constitutional:      General: He is not in acute distress.    Appearance: Normal appearance. He is well-developed. He is not ill-appearing or diaphoretic.  HENT:     Head: Normocephalic and atraumatic.  Eyes:     General: No visual field deficit.    Extraocular Movements: Extraocular movements intact.     Conjunctiva/sclera: Conjunctivae normal.     Pupils: Pupils are equal, round, and reactive to light.  Cardiovascular:     Rate and Rhythm: Normal rate and regular rhythm.     Pulses: Normal pulses.     Heart sounds: Normal heart sounds. No murmur heard.   No friction rub. No gallop.  Pulmonary:     Effort: Pulmonary effort is normal. No respiratory distress.     Breath  sounds: Normal breath sounds. No wheezing or rales.  Abdominal:     General: There is no distension.     Palpations: Abdomen is soft.     Tenderness: There is no abdominal tenderness. There is no guarding.  Musculoskeletal:        General: No swelling or tenderness.     Cervical back: Normal range of motion.  Skin:    General: Skin is warm and dry.     Findings: No erythema or rash.  Neurological:     General: No focal deficit present.     Mental Status: He is alert and oriented to person, place, and time.     GCS: GCS eye subscore is 4. GCS verbal subscore is 5. GCS motor subscore is 6.     Cranial Nerves: No cranial nerve deficit, dysarthria or facial asymmetry.     Sensory: No sensory deficit.     Motor: No weakness or tremor.     Coordination: Coordination normal. Finger-Nose-Finger Test normal.     Gait: Gait normal.    ED Results / Procedures / Treatments   Labs (all labs ordered are listed, but only abnormal results are displayed) Labs Reviewed  CBC WITH DIFFERENTIAL/PLATELET - Abnormal; Notable for the following components:      Result Value   MCHC 36.8 (*)    All other components within normal limits  COMPREHENSIVE METABOLIC PANEL - Abnormal; Notable for the following components:   Glucose, Bld 101 (*)    All other components within normal limits  TROPONIN I (HIGH SENSITIVITY)  TROPONIN I (HIGH SENSITIVITY)    EKG EKG Interpretation  Date/Time:  Thursday January 27 2021 11:37:00 EDT Ventricular Rate:  68 PR Interval:  188 QRS Duration: 116 QT Interval:  418 QTC Calculation: 445 R Axis:   23 Text Interpretation: Sinus rhythm Nonspecific intraventricular conduction delay No significant change since last tracing Confirmed by Gareth Morgan [id number]) on 01/27/2021 1:43:01 PM  Radiology DG Chest 2 View  Result Date: 01/27/2021 CLINICAL DATA:  Chest pain EXAM: CHEST - 2 VIEW COMPARISON:  04/04/2017 FINDINGS: The heart size and mediastinal contours are within  normal limits. Both lungs are clear. Disc degenerative disease of the thoracic spine. IMPRESSION: No acute abnormality of the lungs. Electronically Signed   By: Eddie Candle M.D.   On: 01/27/2021 12:34   CT HEAD WO CONTRAST (5MM)  Result Date: 01/27/2021 CLINICAL DATA:  Transient ischemic attack EXAM: CT HEAD WITHOUT CONTRAST TECHNIQUE: Contiguous axial images were obtained from the base of the skull through the vertex without intravenous contrast. COMPARISON:  None. FINDINGS: Brain: Normal anatomic configuration. Parenchymal volume loss is commensurate with the patient's age. No  abnormal intra or extra-axial mass lesion or fluid collection. No abnormal mass effect or midline shift. No evidence of acute intracranial hemorrhage or infarct. Ventricular size is normal. Cerebellum unremarkable. Vascular: No asymmetric hyperdense vasculature at the skull base. Skull: Intact Sinuses/Orbits: There is mild mucosal thickening within the a sphenoid sinuses, frontal sinuses, as well as opacification of several ethmoid air cells bilaterally in keeping with changes of paranasal sinus disease. No air-fluid levels. The orbits are unremarkable. Other: Mastoid air cells and middle ear cavities are clear. IMPRESSION: No acute intracranial abnormality. Mild paranasal sinus disease. Electronically Signed   By: Fidela Salisbury M.D.   On: 01/27/2021 12:14   MR BRAIN WO CONTRAST  Result Date: 01/27/2021 CLINICAL DATA:  Stroke, follow-up. Additional history obtained from [place] reports right arm numbness EXAM: MRI HEAD WITHOUT CONTRAST TECHNIQUE: Multiplanar, multiecho pulse sequences of the brain and surrounding structures were obtained without intravenous contrast. COMPARISON:  Head CT 01/27/2021. FINDINGS: Brain: Cerebral volume is normal for age. No cortical encephalomalacia is identified. No significant cerebral white matter disease. There is no acute infarct. No evidence of an intracranial mass. No  chronic intracranial blood products. No extra-axial fluid collection. No midline shift. Vascular: Maintained flow voids within the proximal large arterial vessels. Skull and upper cervical spine: No focal suspicious marrow lesion. Prominent ventral osteophyte at C1 with prominent degenerative changes anteriorly at the C1-C2 articulation. Sinuses/Orbits: Visualized orbits show no acute finding. Mild diffuse paranasal sinus mucosal thickening. Small bilateral maxillary sinus mucous retention cysts. IMPRESSION: Unremarkable non-contrast MRI appearance of the brain for age. No evidence of acute intracranial abnormality. Mild diffuse paranasal sinus mucosal thickening. Small bilateral maxillary sinus mucous retention cysts. Degenerative changes at the C1-C2 articulation, as described. Electronically Signed   By: Kellie Simmering D.O.   On: 01/27/2021 16:22    Procedures Procedures   Medications Ordered in ED Medications  hydrochlorothiazide (MICROZIDE) capsule 12.5 mg (has no administration in time range)    ED Course  I have reviewed the triage vital signs and the nursing notes.  Pertinent labs & imaging results that were available during my care of the patient were reviewed by me and considered in my medical decision making (see chart for details).    MDM Rules/Calculators/A&P                           65yo male with history of hypertension presents with concern for episode of right arm numbness.  DDx includes peripheral etiologies, spinal etiology, ACS, TIA, CVA.  Normal pulses, no sign of acute arterial thrombus. No sign of upper extremity DVT. No sign of skin infection.  EKG without significant findings, troponin negative, doubt ACS. CXR evaluated by me without acute findings.  CT head without acute abnormalities.  Discussed with Neurology.  ABCD2 score low risk, feel if no sign of CVA on MRI in setting of symptoms described that outpatient follow up and blood pressure control is appropriate. MRI  pending at time of transfer of care.        Final Clinical Impression(s) / ED Diagnoses Final diagnoses:  Hypertension, unspecified type  Numbness    Rx / DC Orders ED Discharge Orders     None        Gareth Morgan, MD 01/27/21 1651

## 2021-01-27 NOTE — Discharge Instructions (Signed)
Continue to take your blood pressure pill but we will add an additional dose of the hydrochlorothiazide.  You can take them altogether at the same time once a day.  Also try to decrease the salt in your diet.  You can check your blood pressure in the morning and if it is more normal you do not have to take the extra dose of medication.  However if it remains elevated like its been the last few days you can take both pills.

## 2021-01-27 NOTE — ED Notes (Signed)
Pt Back From MRI

## 2021-01-27 NOTE — ED Provider Notes (Signed)
Patient's lab work is reassuring.  MRI without evidence of stroke at this time and neurology cleared the patient stating that he did not need further work-up if his MRI was normal as his symptoms were very localized and unlikely to be TIA.  Patient is however having persistent elevated blood pressure.  When speaking with him he does eat quite a bit of salt and may be the result of why his blood pressure is high.  He is compliant with his medications.  We will increase his dose of hydrochlorothiazide as now he takes 12.5 and will and keep creased to 25 and have him continue the BenzePrO of 20.  Also encouraged him to follow-up with his PCP.  He is otherwise well-appearing and appears stable for discharge.   Blanchie Dessert, MD 01/27/21 [phone]

## 2021-01-27 NOTE — ED Triage Notes (Signed)
Pt reports high BP since last night. He reports waking up this morning with right arm numbness that subsided quickly. Denies chest pain, SHOB, and headache. Pt reports taking BP medication regularly.

## 2021-01-27 NOTE — ED Provider Notes (Signed)
Emergency Medicine Provider Triage Evaluation Note  Jonathan Marks , a 65 y.o. male  was evaluated in triage.  Pt complains of right arm numbness that occurred this morning. States he had fallen a sleep when he woke up right arm was numbness, describes his right arm feeling "tight". He took a shot of vinegar without much improvement in symptoms. Has been taking BP meds. He feel worse than when he woke up this morning. Took his BP meds this am.   Review of Systems  Positive: Right arm numbness, dizzy Negative: Fever, chest pain, shortness of breath  Physical Exam  BP (!) 175/87 (BP Location: Left Arm)   Pulse 78   Temp 98.2 F (36.8 C) (Oral)   Resp 18   Ht 5\' 10"  (1.778 m)   Wt 97.5 kg   SpO2 98%   BMI 30.85 kg/m  Gen:   Awake, no distress   Resp:  Normal effort  MSK:   Moves extremities without difficulty Other:  5/5 strength BLUE and BLLE, sensation intact throughout. No facial assymetry  Medical Decision Making  Medically screening exam initiated at 11:04 AM.  Appropriate orders placed.  Jonathan Marks was informed that the remainder of the evaluation will be completed by another provider, this initial triage assessment does not replace that evaluation, and the importance of remaining in the ED until their evaluation is complete.  Here with elevated BP, slight dizzy positional which began after he woke up from a nap along with right arm numbness. Compliance with BP meds.   Janeece Fitting, PA-C 01/27/21 1109    Gareth Morgan, MD 01/27/21 [phone]

## 2021-01-27 NOTE — ED Notes (Signed)
Pt ambulatory in ED lobby. 

## 2021-01-27 NOTE — ED Notes (Signed)
Pt going to MRI

## 2021-02-02 ENCOUNTER — Ambulatory Visit: Payer: 59 | Admitting: Internal Medicine

## 2021-02-03 ENCOUNTER — Other Ambulatory Visit: Payer: Self-pay | Admitting: Internal Medicine

## 2021-02-03 DIAGNOSIS — I10 Essential (primary) hypertension: Secondary | ICD-10-CM

## 2021-02-09 ENCOUNTER — Other Ambulatory Visit: Payer: Self-pay

## 2021-02-10 ENCOUNTER — Encounter: Payer: Self-pay | Admitting: Internal Medicine

## 2021-02-10 ENCOUNTER — Ambulatory Visit (INDEPENDENT_AMBULATORY_CARE_PROVIDER_SITE_OTHER): Payer: 59 | Admitting: Internal Medicine

## 2021-02-10 VITALS — BP 140/100 | HR 102 | Temp 99.0°F | Wt 218.0 lb

## 2021-02-10 DIAGNOSIS — I10 Essential (primary) hypertension: Secondary | ICD-10-CM | POA: Diagnosis not present

## 2021-02-10 DIAGNOSIS — Z09 Encounter for follow-up examination after completed treatment for conditions other than malignant neoplasm: Secondary | ICD-10-CM

## 2021-02-10 DIAGNOSIS — Z23 Encounter for immunization: Secondary | ICD-10-CM

## 2021-02-10 MED ORDER — HYDROCHLOROTHIAZIDE 25 MG PO TABS: 25.0000 mg | ORAL_TABLET | Freq: Every day | ORAL | 1 refills | Status: DC

## 2021-02-10 MED ORDER — BENAZEPRIL HCL 40 MG PO TABS: 40.0000 mg | ORAL_TABLET | Freq: Every day | ORAL | 1 refills | Status: DC

## 2021-02-10 NOTE — Addendum Note (Signed)
Addended by: Westley Hummer B on: 02/10/2021 03:50 PM   Modules accepted: Orders

## 2021-02-10 NOTE — Patient Instructions (Signed)
-  Nice seeing you today!!  -Stop taking BP meds you have at home.  -Start HCTZ 25 mg daily and benazepril 40 mg daily.  -Schedule follow up in 6 weeks for your blood pressure.  -Low sodium diet.

## 2021-02-10 NOTE — Progress Notes (Signed)
Established Patient Office Visit     This visit occurred during the SARS-CoV-2 public health emergency.  Safety protocols were in place, including screening questions prior to the visit, additional usage of staff PPE, and extensive cleaning of exam room while observing appropriate contact time as indicated for disinfecting solutions.    CC/Reason for Visit: ED follow-up, follow-up hypertension  HPI: Jonathan Marks is a 65 y.o. male who is coming in today for the above mentioned reasons. Past Medical History is significant for: Hypertension and vitamin D deficiency.  During his physical in June he was normotensive.  On September 22 he visited the emergency department due to elevated blood pressure.  He had an MRI which was negative, he was discharged home and asked to follow-up with me today.  He had been on Lotensin HCT 20/12.5 mg daily, it appears the ED added an additional 12.5 mg of hydrochlorothiazide.  He is currently doing well and has no concerns.  His in office blood pressure today is 140/100.   Past Medical/Surgical History: Past Medical History:  Diagnosis Date   Prostatitis    Unspecified essential hypertension 01/21/2007    Past Surgical History:  Procedure Laterality Date    left plantar fasciotomy  October 2012   COLONOSCOPY     age 59   [place]  2001   left , Dr.Weatherly   WISDOM TOOTH EXTRACTION      Social History:  reports that he has quit smoking. He has never used smokeless tobacco. He reports that he does not drink alcohol and does not use drugs.  Allergies: No Known Allergies  Family History:  Family History  Problem Relation Age of Onset   Hypertension Mother    Alcohol abuse Father    Stroke Father    Pancreatic cancer Sister    Alcohol abuse Other    Hypertension Other    Colon cancer Neg Hx    Esophageal cancer Neg Hx    Prostate cancer Neg Hx    Rectal cancer Neg Hx    Stomach cancer Neg Hx       Current Outpatient Medications:    benazepril (LOTENSIN) 40 MG tablet, Take 1 tablet (40 mg total) by mouth daily., Disp: 90 tablet, Rfl: 1   hydrochlorothiazide (HYDRODIURIL) 25 MG tablet, Take 1 tablet (25 mg total) by mouth daily., Disp: 90 tablet, Rfl: 1  Review of Systems:  Constitutional: Denies fever, chills, diaphoresis, appetite change and fatigue.  HEENT: Denies photophobia, eye pain, redness, hearing loss, ear pain, congestion, sore throat, rhinorrhea, sneezing, mouth sores, trouble swallowing, neck pain, neck stiffness and tinnitus.   Respiratory: Denies SOB, DOE, cough, chest tightness,  and wheezing.   Cardiovascular: Denies chest pain, palpitations and leg swelling.  Gastrointestinal: Denies nausea, vomiting, abdominal pain, diarrhea, constipation, blood in stool and abdominal distention.  Genitourinary: Denies dysuria, urgency, frequency, hematuria, flank pain and difficulty urinating.  Endocrine: Denies: hot or cold intolerance, sweats, changes in hair or nails, polyuria, polydipsia. Musculoskeletal: Denies myalgias, back pain, joint swelling, arthralgias and gait problem.  Skin: Denies pallor, rash and wound.  Neurological: Denies dizziness, seizures, syncope, weakness, light-headedness, numbness and headaches.  Hematological: Denies adenopathy. Easy bruising, personal or family bleeding history  Psychiatric/Behavioral: Denies suicidal ideation, mood changes, confusion, nervousness, sleep disturbance and agitation    Physical Exam: Vitals:   02/10/21 1341  BP: (!) 140/100  Pulse: (!) 102  Temp: 99 F (37.2 C)  TempSrc: Oral  SpO2: 93%  Weight: 218 lb (98.9 kg)    Body mass index is 31.28 kg/m.   Constitutional: NAD, calm, comfortable Eyes: PERRL, lids and conjunctivae normal, wears corrective lenses ENMT: Mucous membranes are moist.  Respiratory: clear to auscultation bilaterally, no wheezing, no crackles. Normal respiratory effort. No accessory muscle  use.  Cardiovascular: Regular rate and rhythm, no murmurs / rubs / gallops. No extremity edema.  Neurologic: Grossly intact and nonfocal Psychiatric: Normal judgment and insight. Alert and oriented x 3. Normal mood.    Impression and Plan:  Hospital discharge follow-up Essential hypertension  - Plan: hydrochlorothiazide (HYDRODIURIL) 25 MG tablet, benazepril (LOTENSIN) 40 MG tablet -To simplify his regimen I will prescribe hydrochlorothiazide 25 mg daily and benazepril 40 mg daily which is an increased dose from 20 mg daily. -He will return in 6 weeks for follow-up.  Need for influenza vaccination -Flu vaccine administered today.  Time spent: 31 minutes reviewing chart including hospital records, interviewing and examining patient and formulating plan of care.   Patient Instructions  -Nice seeing you today!!  -Stop taking BP meds you have at home.  -Start HCTZ 25 mg daily and benazepril 40 mg daily.  -Schedule follow up in 6 weeks for your blood pressure.  -Low sodium diet.    Lelon Frohlich, MD [facility]

## 2021-03-24 ENCOUNTER — Ambulatory Visit (INDEPENDENT_AMBULATORY_CARE_PROVIDER_SITE_OTHER): Payer: 59 | Admitting: Internal Medicine

## 2021-03-24 ENCOUNTER — Encounter: Payer: Self-pay | Admitting: Internal Medicine

## 2021-03-24 VITALS — BP 168/92 | HR 82 | Temp 98.1°F | Ht 70.0 in | Wt 228.3 lb

## 2021-03-24 DIAGNOSIS — I10 Essential (primary) hypertension: Secondary | ICD-10-CM

## 2021-03-24 MED ORDER — AMLODIPINE BESYLATE 5 MG PO TABS: 5.0000 mg | ORAL_TABLET | Freq: Every day | ORAL | 1 refills | Status: DC

## 2021-03-24 NOTE — Patient Instructions (Signed)
-  Nice seeing you today!!  -Start amlodipine 5 mg daily.  -Keep taking benazepril 40 mg and HCTZ 25 mg daily.  -Schedule follow up in 6 weeks.

## 2021-03-24 NOTE — Progress Notes (Signed)
Established Patient Office Visit     This visit occurred during the SARS-CoV-2 public health emergency.  Safety protocols were in place, including screening questions prior to the visit, additional usage of staff PPE, and extensive cleaning of exam room while observing appropriate contact time as indicated for disinfecting solutions.    CC/Reason for Visit: Follow-up blood pressure  HPI: Jonathan Marks is a 65 y.o. male who is coming in today for the above mentioned reasons. Past Medical History is significant for: Hypertension and vitamin D deficiency.  At last visit his benazepril was increased from 20 to 40 mg, he is also on hydrochlorothiazide 25 mg daily.  He states his home blood pressure measurements are anywhere from 086-761 systolic and 95-09 diastolic.  He feels well and has no acute concerns today.  He states he has been adherent to medical therapy.   Past Medical/Surgical History: Past Medical History:  Diagnosis Date   Prostatitis    Unspecified essential hypertension 01/21/2007    Past Surgical History:  Procedure Laterality Date    left plantar fasciotomy  October 2012   COLONOSCOPY     age 36   [place]  2001   left , Dr.Weatherly   WISDOM TOOTH EXTRACTION      Social History:  reports that he has quit smoking. He has never used smokeless tobacco. He reports that he does not drink alcohol and does not use drugs.  Allergies: No Known Allergies  Family History:  Family History  Problem Relation Age of Onset   Hypertension Mother    Alcohol abuse Father    Stroke Father    Pancreatic cancer Sister    Alcohol abuse Other    Hypertension Other    Colon cancer Neg Hx    Esophageal cancer Neg Hx    Prostate cancer Neg Hx    Rectal cancer Neg Hx    Stomach cancer Neg Hx      Current Outpatient Medications:    amLODipine (NORVASC) 5 MG tablet, Take 1 tablet (5 mg total) by mouth daily., Disp: 90 tablet, Rfl: 1    benazepril (LOTENSIN) 40 MG tablet, Take 1 tablet (40 mg total) by mouth daily., Disp: 90 tablet, Rfl: 1   hydrochlorothiazide (HYDRODIURIL) 25 MG tablet, Take 1 tablet (25 mg total) by mouth daily., Disp: 90 tablet, Rfl: 1  Review of Systems:  Constitutional: Denies fever, chills, diaphoresis, appetite change and fatigue.  HEENT: Denies photophobia, eye pain, redness, hearing loss, ear pain, congestion, sore throat, rhinorrhea, sneezing, mouth sores, trouble swallowing, neck pain, neck stiffness and tinnitus.   Respiratory: Denies SOB, DOE, cough, chest tightness,  and wheezing.   Cardiovascular: Denies chest pain, palpitations and leg swelling.  Gastrointestinal: Denies nausea, vomiting, abdominal pain, diarrhea, constipation, blood in stool and abdominal distention.  Genitourinary: Denies dysuria, urgency, frequency, hematuria, flank pain and difficulty urinating.  Endocrine: Denies: hot or cold intolerance, sweats, changes in hair or nails, polyuria, polydipsia. Musculoskeletal: Denies myalgias, back pain, joint swelling, arthralgias and gait problem.  Skin: Denies pallor, rash and wound.  Neurological: Denies dizziness, seizures, syncope, weakness, light-headedness, numbness and headaches.  Hematological: Denies adenopathy. Easy bruising, personal or family bleeding history  Psychiatric/Behavioral: Denies suicidal ideation, mood changes, confusion, nervousness, sleep disturbance and agitation    Physical Exam: Vitals:   03/24/21 1143  BP: (!) 168/92  Pulse: 82  Temp: 98.1 F (36.7 C)  TempSrc: Oral  SpO2: 99%  Weight: 228 lb 4.8 oz (103.6 kg)  Height: 5\' 10"  (1.778 m)    Body mass index is 32.76 kg/m.   Constitutional: NAD, calm, comfortable Eyes: PERRL, lids and conjunctivae normal, wears corrective lenses ENMT: Mucous membranes are moist.  Respiratory: clear to auscultation bilaterally, no wheezing, no crackles. Normal respiratory effort. No accessory muscle use.   Cardiovascular: Regular rate and rhythm, no murmurs / rubs / gallops. No extremity edema.  Neurologic: Grossly intact and nonfocal Psychiatric: Normal judgment and insight. Alert and oriented x 3. Normal mood.    Impression and Plan:  Essential hypertension  - Plan: amLODipine (NORVASC) 5 MG tablet -Continue benazepril 40 and hydrochlorothiazide 25, add amlodipine 5. -Return in 6 weeks for follow-up.  Time spent: 30 minutes reviewing chart, interviewing and examining patient and formulating plan of care.   Patient Instructions  -Nice seeing you today!!  -Start amlodipine 5 mg daily.  -Keep taking benazepril 40 mg and HCTZ 25 mg daily.  -Schedule follow up in 6 weeks.    Lelon Frohlich, MD  [facility] at [facility]

## 2021-04-12 ENCOUNTER — Ambulatory Visit: Payer: BC Managed Care – PPO | Admitting: Internal Medicine

## 2021-04-19 ENCOUNTER — Ambulatory Visit (INDEPENDENT_AMBULATORY_CARE_PROVIDER_SITE_OTHER): Payer: 59 | Admitting: Internal Medicine

## 2021-04-19 ENCOUNTER — Encounter: Payer: Self-pay | Admitting: Internal Medicine

## 2021-04-19 VITALS — BP 140/84 | HR 88 | Temp 98.1°F | Ht 70.0 in | Wt 228.0 lb

## 2021-04-19 DIAGNOSIS — I10 Essential (primary) hypertension: Secondary | ICD-10-CM

## 2021-04-19 MED ORDER — AMLODIPINE BESYLATE 10 MG PO TABS: 10.0000 mg | ORAL_TABLET | Freq: Every day | ORAL | 1 refills | Status: DC

## 2021-04-19 NOTE — Patient Instructions (Signed)
-  Nice seeing you today!!  -Increase amlodipine from 5 to 10 mg daily.  -Continue benazepril 40 mg and HCTZ 25 mg daily.  -Schedule follow up in 8 weeks.

## 2021-04-19 NOTE — Progress Notes (Signed)
Established Patient Office Visit     This visit occurred during the SARS-CoV-2 public health emergency.  Safety protocols were in place, including screening questions prior to the visit, additional usage of staff PPE, and extensive cleaning of exam room while observing appropriate contact time as indicated for disinfecting solutions.    CC/Reason for Visit: Blood pressure follow-up  HPI: Jonathan Marks is a 65 y.o. male who is coming in today for the above mentioned reasons. Past Medical History is significant for: Hypertension.  At last visit amlodipine 5 mg was added to hydrochlorothiazide 25 mg and benazepril 40 mg daily due to uncontrolled blood pressure.  He brings in his ambulatory log with systolic blood pressures in the 026-378 and diastolic blood pressure in the upper 80-90 range.  He feels well and has no acute concerns.  He has had his flu and his COVID booster.   Past Medical/Surgical History: Past Medical History:  Diagnosis Date   Prostatitis    Unspecified essential hypertension 01/21/2007    Past Surgical History:  Procedure Laterality Date    left plantar fasciotomy  October 2012   COLONOSCOPY     age 22   [place]  2001   left , Dr.Weatherly   WISDOM TOOTH EXTRACTION      Social History:  reports that he has quit smoking. He has never used smokeless tobacco. He reports that he does not drink alcohol and does not use drugs.  Allergies: No Known Allergies  Family History:  Family History  Problem Relation Age of Onset   Hypertension Mother    Alcohol abuse Father    Stroke Father    Pancreatic cancer Sister    Alcohol abuse Other    Hypertension Other    Colon cancer Neg Hx    Esophageal cancer Neg Hx    Prostate cancer Neg Hx    Rectal cancer Neg Hx    Stomach cancer Neg Hx      Current Outpatient Medications:    benazepril (LOTENSIN) 40 MG tablet, Take 1 tablet (40 mg total) by mouth daily., Disp: 90  tablet, Rfl: 1   hydrochlorothiazide (HYDRODIURIL) 25 MG tablet, Take 1 tablet (25 mg total) by mouth daily., Disp: 90 tablet, Rfl: 1   amLODipine (NORVASC) 10 MG tablet, Take 1 tablet (10 mg total) by mouth daily., Disp: 90 tablet, Rfl: 1  Review of Systems:  Constitutional: Denies fever, chills, diaphoresis, appetite change and fatigue.  HEENT: Denies photophobia, eye pain, redness, hearing loss, ear pain, congestion, sore throat, rhinorrhea, sneezing, mouth sores, trouble swallowing, neck pain, neck stiffness and tinnitus.   Respiratory: Denies SOB, DOE, cough, chest tightness,  and wheezing.   Cardiovascular: Denies chest pain, palpitations and leg swelling.  Gastrointestinal: Denies nausea, vomiting, abdominal pain, diarrhea, constipation, blood in stool and abdominal distention.  Genitourinary: Denies dysuria, urgency, frequency, hematuria, flank pain and difficulty urinating.  Endocrine: Denies: hot or cold intolerance, sweats, changes in hair or nails, polyuria, polydipsia. Musculoskeletal: Denies myalgias, back pain, joint swelling, arthralgias and gait problem.  Skin: Denies pallor, rash and wound.  Neurological: Denies dizziness, seizures, syncope, weakness, light-headedness, numbness and headaches.  Hematological: Denies adenopathy. Easy bruising, personal or family bleeding history  Psychiatric/Behavioral: Denies suicidal ideation, mood changes, confusion, nervousness, sleep disturbance and agitation    Physical Exam: Vitals:   04/19/21 1136  BP: 140/84  Pulse: 88  Temp: 98.1 F (36.7 C)  TempSrc: Oral  SpO2: 98%  Weight: 228 lb (103.4 kg)  Height: 5\' 10"  (1.778 m)    Body mass index is 32.71 kg/m.   Constitutional: NAD, calm, comfortable Eyes: PERRL, lids and conjunctivae normal, wears corrective lenses ENMT: Mucous membranes are moist.  Respiratory: clear to auscultation bilaterally, no wheezing, no crackles. Normal respiratory effort. No accessory muscle use.   Cardiovascular: Regular rate and rhythm, no murmurs / rubs / gallops. No extremity edema.  Neurologic: Grossly intact and nonfocal Psychiatric: Normal judgment and insight. Alert and oriented x 3. Normal mood.    Impression and Plan:  Essential hypertension  - Plan: amLODipine (NORVASC) 10 MG tablet -Blood pressure remains uncontrolled.  Increase amlodipine from 5 to 10 mg daily.  Continue hydrochlorothiazide 25 mg daily and benazepril 40 mg daily. -Continue home blood pressure monitoring and return in 6 to 8 weeks for follow-up.   Time spent: 31 minutes reviewing chart, interviewing and examining patient and formulating plan of care.   Patient Instructions  -Nice seeing you today!!  -Increase amlodipine from 5 to 10 mg daily.  -Continue benazepril 40 mg and HCTZ 25 mg daily.  -Schedule follow up in 8 weeks.    Lelon Frohlich, MD [facility] at [facility]

## 2021-05-05 ENCOUNTER — Other Ambulatory Visit: Payer: Self-pay | Admitting: Internal Medicine

## 2021-05-05 DIAGNOSIS — E559 Vitamin D deficiency, unspecified: Secondary | ICD-10-CM

## 2021-06-14 ENCOUNTER — Ambulatory Visit: Payer: 59 | Admitting: Internal Medicine

## 2021-07-07 ENCOUNTER — Ambulatory Visit: Payer: Managed Care, Other (non HMO) | Admitting: Internal Medicine

## 2021-07-07 ENCOUNTER — Encounter: Payer: Self-pay | Admitting: Internal Medicine

## 2021-07-07 DIAGNOSIS — I10 Essential (primary) hypertension: Secondary | ICD-10-CM | POA: Diagnosis not present

## 2021-07-07 LAB — COMPREHENSIVE METABOLIC PANEL
ALT: 26 U/L (ref 0–53)
AST: 26 U/L (ref 0–37)
Albumin: 4.3 g/dL (ref 3.5–5.2)
Alkaline Phosphatase: 38 U/L — ABNORMAL LOW (ref 39–117)
BUN: 17 mg/dL (ref 6–23)
CO2: 29 mEq/L (ref 19–32)
Calcium: 9.3 mg/dL (ref 8.4–10.5)
Chloride: 103 mEq/L (ref 96–112)
Creatinine, Ser: 1.23 mg/dL (ref 0.40–1.50)
GFR: 62.12 mL/min (ref >60.00)
Glucose, Bld: 97 mg/dL (ref 70–99)
Potassium: 3.8 mEq/L (ref 3.5–5.1)
Sodium: 138 mEq/L (ref 135–145)
Total Bilirubin: 0.8 mg/dL (ref 0.2–1.2)
Total Protein: 7 g/dL (ref 6.0–8.3)

## 2021-07-07 LAB — MAGNESIUM: Magnesium: 1.9 mg/dL (ref 1.5–2.5)

## 2021-07-07 MED ORDER — VALSARTAN-HYDROCHLOROTHIAZIDE 320-25 MG PO TABS: 1.0000 | ORAL_TABLET | Freq: Every day | ORAL | 1 refills | Status: DC

## 2021-07-07 MED ORDER — AMLODIPINE BESYLATE 10 MG PO TABS: 10.0000 mg | ORAL_TABLET | Freq: Every day | ORAL | 1 refills | Status: DC

## 2021-07-07 NOTE — Progress Notes (Signed)
? ? ? ?Established Patient Office Visit ? ? ? ? ?This visit occurred during the SARS-CoV-2 public health emergency.  Safety protocols were in place, including screening questions prior to the visit, additional usage of staff PPE, and extensive cleaning of exam room while observing appropriate contact time as indicated for disinfecting solutions.  ? ? ?CC/Reason for Visit: Blood pressure follow-up ? ?HPI: Jonathan Marks is a 66 y.o. male who is coming in today for the above mentioned reasons.  He is being follow-up for blood pressure.  At last visit we increased his amlodipine from 5 to 10 mg in addition to hydrochlorothiazide 25 mg and benazepril 40 mg.  He states at home his systolics are around 1 60-4 50 with diastolics in the upper 54U to 90s.  In office blood pressure today is 160/90. ? ?Past Medical/Surgical History: ?Past Medical History:  ?Diagnosis Date  ? Prostatitis   ? Unspecified essential hypertension 01/21/2007  ? ? ?Past Surgical History:  ?Procedure Laterality Date  ?  left plantar fasciotomy  October 2012  ? COLONOSCOPY    ? age 29  ? HERNIA REPAIR    ? INGUINAL HERNIA REPAIR  2001  ? left , Dr.Weatherly  ? WISDOM TOOTH EXTRACTION    ? ? ?Social History: ? reports that he has quit smoking. He has never used smokeless tobacco. He reports that he does not drink alcohol and does not use drugs. ? ?Allergies: ?No Known Allergies ? ?Family History:  ?Family History  ?Problem Relation Age of Onset  ? Hypertension Mother   ? Alcohol abuse Father   ? Stroke Father   ? Pancreatic cancer Sister   ? Alcohol abuse Other   ? Hypertension Other   ? Colon cancer Neg Hx   ? Esophageal cancer Neg Hx   ? Prostate cancer Neg Hx   ? Rectal cancer Neg Hx   ? Stomach cancer Neg Hx   ? ? ? ?Current Outpatient Medications:  ?  valsartan-hydrochlorothiazide (DIOVAN-HCT) 320-25 MG tablet, Take 1 tablet by mouth daily., Disp: 90 tablet, Rfl: 1 ?  amLODipine (NORVASC) 10 MG tablet, Take 1 tablet (10 mg total) by mouth  daily., Disp: 90 tablet, Rfl: 1 ? ?Review of Systems:  ?Constitutional: Denies fever, chills, diaphoresis, appetite change and fatigue.  ?HEENT: Denies photophobia, eye pain, redness, hearing loss, ear pain, congestion, sore throat, rhinorrhea, sneezing, mouth sores, trouble swallowing, neck pain, neck stiffness and tinnitus.   ?Respiratory: Denies SOB, DOE, cough, chest tightness,  and wheezing.   ?Cardiovascular: Denies chest pain, palpitations and leg swelling.  ?Gastrointestinal: Denies nausea, vomiting, abdominal pain, diarrhea, constipation, blood in stool and abdominal distention.  ?Genitourinary: Denies dysuria, urgency, frequency, hematuria, flank pain and difficulty urinating.  ?Endocrine: Denies: hot or cold intolerance, sweats, changes in hair or nails, polyuria, polydipsia. ?Musculoskeletal: Denies myalgias, back pain, joint swelling, arthralgias and gait problem.  ?Skin: Denies pallor, rash and wound.  ?Neurological: Denies dizziness, seizures, syncope, weakness, light-headedness, numbness and headaches.  ?Hematological: Denies adenopathy. Easy bruising, personal or family bleeding history  ?Psychiatric/Behavioral: Denies suicidal ideation, mood changes, confusion, nervousness, sleep disturbance and agitation ? ? ? ?Physical Exam: ?Vitals:  ? 07/07/21 1008  ?BP: (!) 160/90  ?Pulse: 67  ?Temp: 98.7 ?F (37.1 ?C)  ?TempSrc: Oral  ?SpO2: 97%  ?Weight: 227 lb 3.2 oz (103.1 kg)  ? ? ?Body mass index is 32.6 kg/m?. ? ? ?Constitutional: NAD, calm, comfortable ?Eyes: PERRL, lids and conjunctivae normal, wears corrective lenses ?ENMT: Mucous membranes  are moist.  ?Respiratory: clear to auscultation bilaterally, no wheezing, no crackles. Normal respiratory effort. No accessory muscle use.  ?Cardiovascular: Regular rate and rhythm, no murmurs / rubs / gallops. No extremity edema.  ?Neurologic: Grossly intact and nonfocal ?Psychiatric: Normal judgment and insight. Alert and oriented x 3. Normal mood.   ? ? ?Impression and Plan: ? ?Essential hypertension  ?-Remains uncontrolled.  Continue amlodipine 10 mg, discontinue benazepril and HCTZ, start Diovan HCT 320/25 mg. ?-Advised to do ambulatory blood pressure monitoring and return in 6 weeks for follow-up. ? ?Time spent: 30 minutes reviewing chart, interviewing and examining patient and formulating plan of care. ? ? ?Patient Instructions  ?-Nice seeing you today!! ? ?-Lab work today; will notify you once results are available. ? ?-Stop HCTZ and benazepril. ? ?-Start diovan HCT 320/25 mg daily. ? ?-Continue amlodipine 10 mg daily. ? ?-Schedule follow up in 6 weeks. ? ? ? ? ?Lelon Frohlich, MD ?[facility] at [facility] ? ? ?

## 2021-07-07 NOTE — Patient Instructions (Signed)
-  Nice seeing you today!! ? ?-Lab work today; will notify you once results are available. ? ?-Stop HCTZ and benazepril. ? ?-Start diovan HCT 320/25 mg daily. ? ?-Continue amlodipine 10 mg daily. ? ?-Schedule follow up in 6 weeks. ? ?

## 2021-07-30 ENCOUNTER — Other Ambulatory Visit: Payer: Self-pay | Admitting: Internal Medicine

## 2021-07-30 DIAGNOSIS — I10 Essential (primary) hypertension: Secondary | ICD-10-CM

## 2021-07-31 ENCOUNTER — Other Ambulatory Visit: Payer: Self-pay | Admitting: Internal Medicine

## 2021-07-31 DIAGNOSIS — I10 Essential (primary) hypertension: Secondary | ICD-10-CM

## 2021-08-01 ENCOUNTER — Telehealth: Payer: Self-pay | Admitting: Internal Medicine

## 2021-08-27 ENCOUNTER — Other Ambulatory Visit: Payer: Self-pay | Admitting: Internal Medicine

## 2021-08-27 DIAGNOSIS — I10 Essential (primary) hypertension: Secondary | ICD-10-CM

## 2021-10-25 ENCOUNTER — Other Ambulatory Visit: Payer: Self-pay | Admitting: Internal Medicine

## 2021-10-25 DIAGNOSIS — I10 Essential (primary) hypertension: Secondary | ICD-10-CM

## 2021-10-28 ENCOUNTER — Other Ambulatory Visit: Payer: Self-pay

## 2021-10-28 DIAGNOSIS — I10 Essential (primary) hypertension: Secondary | ICD-10-CM

## 2021-10-28 MED ORDER — VALSARTAN-HYDROCHLOROTHIAZIDE 320-25 MG PO TABS: 1.0000 | ORAL_TABLET | Freq: Every day | ORAL | 1 refills | Status: DC

## 2021-11-15 ENCOUNTER — Ambulatory Visit: Payer: Managed Care, Other (non HMO) | Admitting: Internal Medicine

## 2021-11-28 ENCOUNTER — Other Ambulatory Visit: Payer: Self-pay | Admitting: Internal Medicine

## 2021-11-28 DIAGNOSIS — I10 Essential (primary) hypertension: Secondary | ICD-10-CM

## 2021-11-29 ENCOUNTER — Encounter: Payer: Self-pay | Admitting: Internal Medicine

## 2021-11-29 ENCOUNTER — Ambulatory Visit: Payer: Commercial Managed Care - HMO | Admitting: Internal Medicine

## 2021-11-29 VITALS — BP 123/79 | HR 100 | Temp 97.9°F | Wt 230.6 lb

## 2021-11-29 DIAGNOSIS — I10 Essential (primary) hypertension: Secondary | ICD-10-CM

## 2021-11-29 NOTE — Progress Notes (Signed)
Established Patient Office Visit     CC/Reason for Visit: Blood pressure follow-up  HPI: Jonathan Marks is a 66 y.o. male who is coming in today for the above mentioned reasons. Past Medical History is significant for: Hypertension, obesity, vitamin D deficiency.  He is here today for blood pressure follow-up.  He is currently on amlodipine 10 mg daily and Diovan HCT 320/25 mg daily.  He is adherent to medication and tolerating it well.  He states that systolic blood pressures at home are usually between 120 and 676 with diastolics between 70 and 80.   Past Medical/Surgical History: Past Medical History:  Diagnosis Date   Prostatitis    Unspecified essential hypertension 01/21/2007    Past Surgical History:  Procedure Laterality Date    left plantar fasciotomy  October 2012   COLONOSCOPY     age 80   [place]  2001   left , Dr.Weatherly   WISDOM TOOTH EXTRACTION      Social History:  reports that he has quit smoking. He has never used smokeless tobacco. He reports that he does not drink alcohol and does not use drugs.  Allergies: No Known Allergies  Family History:  Family History  Problem Relation Age of Onset   Hypertension Mother    Alcohol abuse Father    Stroke Father    Pancreatic cancer Sister    Alcohol abuse Other    Hypertension Other    Colon cancer Neg Hx    Esophageal cancer Neg Hx    Prostate cancer Neg Hx    Rectal cancer Neg Hx    Stomach cancer Neg Hx      Current Outpatient Medications:    amLODipine (NORVASC) 10 MG tablet, Take 1 tablet (10 mg total) by mouth daily., Disp: 90 tablet, Rfl: 1   valsartan-hydrochlorothiazide (DIOVAN-HCT) 320-25 MG tablet, Take 1 tablet by mouth daily., Disp: 90 tablet, Rfl: 1  Review of Systems:  Constitutional: Denies fever, chills, diaphoresis, appetite change and fatigue.  HEENT: Denies photophobia, eye pain, redness, hearing loss, ear pain, congestion, sore throat,  rhinorrhea, sneezing, mouth sores, trouble swallowing, neck pain, neck stiffness and tinnitus.   Respiratory: Denies SOB, DOE, cough, chest tightness,  and wheezing.   Cardiovascular: Denies chest pain, palpitations and leg swelling.  Gastrointestinal: Denies nausea, vomiting, abdominal pain, diarrhea, constipation, blood in stool and abdominal distention.  Genitourinary: Denies dysuria, urgency, frequency, hematuria, flank pain and difficulty urinating.  Endocrine: Denies: hot or cold intolerance, sweats, changes in hair or nails, polyuria, polydipsia. Musculoskeletal: Denies myalgias, back pain, joint swelling, arthralgias and gait problem.  Skin: Denies pallor, rash and wound.  Neurological: Denies dizziness, seizures, syncope, weakness, light-headedness, numbness and headaches.  Hematological: Denies adenopathy. Easy bruising, personal or family bleeding history  Psychiatric/Behavioral: Denies suicidal ideation, mood changes, confusion, nervousness, sleep disturbance and agitation    Physical Exam: Vitals:   11/29/21 1337 11/29/21 1341  BP: (!) 130/94 123/79  Pulse: 100   Temp: 97.9 F (36.6 C)   TempSrc: Oral   Weight: 230 lb 9.6 oz (104.6 kg)     Body mass index is 33.09 kg/m.   Constitutional: NAD, calm, comfortable Eyes: PERRL, lids and conjunctivae normal, wears corrective lenses ENMT: Mucous membranes are moist.  Respiratory: clear to auscultation bilaterally, no wheezing, no crackles. Normal respiratory effort. No accessory muscle use.  Cardiovascular: Regular rate and rhythm, no murmurs / rubs / gallops. No extremity edema.  Psychiatric: Normal judgment and insight. Alert and oriented x 3. Normal mood.    Impression and Plan:  Essential hypertension -Blood pressure is well controlled, continue amlodipine, valsartan, hydrochlorothiazide.  Follow-up in 3 months.   Time spent:24 minutes reviewing chart, interviewing and examining patient and formulating plan of  care.    Lelon Frohlich, MD [facility] at [facility]

## 2021-12-24 ENCOUNTER — Other Ambulatory Visit: Payer: Self-pay | Admitting: Internal Medicine

## 2021-12-24 DIAGNOSIS — I10 Essential (primary) hypertension: Secondary | ICD-10-CM

## 2022-01-23 ENCOUNTER — Other Ambulatory Visit: Payer: Self-pay | Admitting: Internal Medicine

## 2022-01-23 DIAGNOSIS — I10 Essential (primary) hypertension: Secondary | ICD-10-CM

## 2022-02-02 ENCOUNTER — Encounter: Payer: Self-pay | Admitting: Gastroenterology

## 2022-02-21 ENCOUNTER — Other Ambulatory Visit: Payer: Self-pay | Admitting: Internal Medicine

## 2022-02-21 DIAGNOSIS — I10 Essential (primary) hypertension: Secondary | ICD-10-CM

## 2022-03-01 ENCOUNTER — Ambulatory Visit: Payer: Managed Care, Other (non HMO) | Admitting: Internal Medicine

## 2022-03-20 ENCOUNTER — Other Ambulatory Visit: Payer: Self-pay | Admitting: Internal Medicine

## 2022-03-20 DIAGNOSIS — I10 Essential (primary) hypertension: Secondary | ICD-10-CM

## 2022-04-03 DIAGNOSIS — Z23 Encounter for immunization: Secondary | ICD-10-CM | POA: Diagnosis not present

## 2022-04-03 DIAGNOSIS — Z79899 Other long term (current) drug therapy: Secondary | ICD-10-CM | POA: Diagnosis not present

## 2022-04-03 DIAGNOSIS — I10 Essential (primary) hypertension: Secondary | ICD-10-CM | POA: Diagnosis not present

## 2022-04-03 DIAGNOSIS — Z6833 Body mass index (BMI) 33.0-33.9, adult: Secondary | ICD-10-CM | POA: Diagnosis not present

## 2022-04-03 DIAGNOSIS — Z87891 Personal history of nicotine dependence: Secondary | ICD-10-CM | POA: Diagnosis not present

## 2022-04-03 DIAGNOSIS — E669 Obesity, unspecified: Secondary | ICD-10-CM | POA: Diagnosis not present

## 2022-04-11 DIAGNOSIS — Z79899 Other long term (current) drug therapy: Secondary | ICD-10-CM | POA: Diagnosis not present

## 2022-04-11 DIAGNOSIS — Z1159 Encounter for screening for other viral diseases: Secondary | ICD-10-CM | POA: Diagnosis not present

## 2022-04-11 DIAGNOSIS — E559 Vitamin D deficiency, unspecified: Secondary | ICD-10-CM | POA: Diagnosis not present

## 2022-04-17 DIAGNOSIS — Z87891 Personal history of nicotine dependence: Secondary | ICD-10-CM | POA: Diagnosis not present

## 2022-04-17 DIAGNOSIS — I10 Essential (primary) hypertension: Secondary | ICD-10-CM | POA: Diagnosis not present

## 2022-04-17 DIAGNOSIS — Z6833 Body mass index (BMI) 33.0-33.9, adult: Secondary | ICD-10-CM | POA: Diagnosis not present

## 2022-04-17 DIAGNOSIS — R011 Cardiac murmur, unspecified: Secondary | ICD-10-CM | POA: Diagnosis not present

## 2022-04-17 DIAGNOSIS — Z0001 Encounter for general adult medical examination with abnormal findings: Secondary | ICD-10-CM | POA: Diagnosis not present

## 2022-04-17 DIAGNOSIS — E559 Vitamin D deficiency, unspecified: Secondary | ICD-10-CM | POA: Diagnosis not present

## 2022-04-17 DIAGNOSIS — E669 Obesity, unspecified: Secondary | ICD-10-CM | POA: Diagnosis not present

## 2022-06-15 ENCOUNTER — Other Ambulatory Visit: Payer: Self-pay | Admitting: Internal Medicine

## 2022-06-15 DIAGNOSIS — I10 Essential (primary) hypertension: Secondary | ICD-10-CM

## 2022-09-08 ENCOUNTER — Other Ambulatory Visit: Payer: Self-pay | Admitting: Internal Medicine

## 2022-09-08 DIAGNOSIS — I10 Essential (primary) hypertension: Secondary | ICD-10-CM

## 2022-09-19 ENCOUNTER — Other Ambulatory Visit: Payer: Self-pay | Admitting: Internal Medicine

## 2022-09-19 DIAGNOSIS — I10 Essential (primary) hypertension: Secondary | ICD-10-CM

## 2022-10-24 NOTE — Telephone Encounter (Signed)
disregard

## 2022-12-08 ENCOUNTER — Other Ambulatory Visit: Payer: Self-pay | Admitting: Internal Medicine

## 2022-12-08 DIAGNOSIS — I10 Essential (primary) hypertension: Secondary | ICD-10-CM

## 2022-12-15 ENCOUNTER — Other Ambulatory Visit: Payer: Self-pay | Admitting: Internal Medicine

## 2022-12-15 DIAGNOSIS — I10 Essential (primary) hypertension: Secondary | ICD-10-CM

## 2022-12-27 ENCOUNTER — Other Ambulatory Visit: Payer: Self-pay | Admitting: Internal Medicine

## 2022-12-27 DIAGNOSIS — I10 Essential (primary) hypertension: Secondary | ICD-10-CM

## 2023-01-07 ENCOUNTER — Emergency Department (HOSPITAL_COMMUNITY): Payer: Medicare HMO

## 2023-01-07 ENCOUNTER — Emergency Department (HOSPITAL_COMMUNITY)
Admission: EM | Admit: 2023-01-07 | Discharge: 2023-01-08 | Disposition: A | Discharge status: Home / Self Care | Payer: Medicare HMO | Attending: Emergency Medicine | Admitting: Emergency Medicine

## 2023-01-07 ENCOUNTER — Encounter (HOSPITAL_COMMUNITY): Payer: Self-pay

## 2023-01-07 DIAGNOSIS — U071 COVID-19: Secondary | ICD-10-CM | POA: Diagnosis not present

## 2023-01-07 DIAGNOSIS — R059 Cough, unspecified: Secondary | ICD-10-CM | POA: Diagnosis present

## 2023-01-07 LAB — BASIC METABOLIC PANEL
Anion gap: 11 (ref 5–15)
BUN: 17 mg/dL (ref 8–23)
CO2: 24 mmol/L (ref 22–32)
Calcium: 9.2 mg/dL (ref 8.9–10.3)
Chloride: 101 mmol/L (ref 98–111)
Creatinine, Ser: 1.28 mg/dL — ABNORMAL HIGH (ref 0.61–1.24)
GFR, Estimated: >60 mL/min (ref >60)
Glucose, Bld: 143 mg/dL — ABNORMAL HIGH (ref 70–99)
Potassium: 3.6 mmol/L (ref 3.5–5.1)
Sodium: 136 mmol/L (ref 135–145)

## 2023-01-07 LAB — CBC
HCT: 41.2 % (ref 39.0–52.0)
Hemoglobin: 15.1 g/dL (ref 13.0–17.0)
MCH: 30.9 pg (ref 26.0–34.0)
MCHC: 36.7 g/dL — ABNORMAL HIGH (ref 30.0–36.0)
MCV: 84.3 fL (ref 80.0–100.0)
Platelets: 220 10*3/uL (ref 150–400)
RBC: 4.89 MIL/uL (ref 4.22–5.81)
RDW: 12.8 % (ref 11.5–15.5)
WBC: 7.9 10*3/uL (ref 4.0–10.5)
nRBC: 0 % (ref 0.0–0.2)

## 2023-01-07 LAB — SARS CORONAVIRUS 2 BY RT PCR: SARS Coronavirus 2 by RT PCR: POSITIVE — AB

## 2023-01-07 LAB — TROPONIN I (HIGH SENSITIVITY): Troponin I (High Sensitivity): 7 ng/L (ref <18)

## 2023-01-07 MED ORDER — ACETAMINOPHEN 325 MG PO TABS
650.0000 mg | ORAL_TABLET | Freq: Once | ORAL | Status: AC
  Administered 2023-01-07: 650 mg via ORAL
  Filled 2023-01-07: qty 2

## 2023-01-07 NOTE — ED Triage Notes (Signed)
Pt states that he has been having CP, L sided that radiates to his back on and off all day with a cough. Pain also shoots down into his abd and into his L groin area

## 2023-01-08 ENCOUNTER — Other Ambulatory Visit: Payer: Self-pay

## 2023-01-08 MED ORDER — BENZONATATE 200 MG PO CAPS: 200.0000 mg | ORAL_CAPSULE | Freq: Three times a day (TID) | ORAL | 0 refills | Status: AC | PRN

## 2023-01-08 MED ORDER — PAXLOVID (300/100) 20 X 150 MG & 10 X 100MG PO TBPK
3.0000 | ORAL_TABLET | Freq: Two times a day (BID) | ORAL | 0 refills | Status: AC
Start: 2023-01-08 — End: 2023-01-13

## 2023-01-08 MED ORDER — HYDROCODONE BIT-HOMATROP MBR 5-1.5 MG/5ML PO SOLN: 5.0000 mL | Freq: Four times a day (QID) | ORAL | 0 refills | Status: AC | PRN

## 2023-01-08 NOTE — ED Provider Notes (Signed)
[place] EMERGENCY DEPARTMENT AT [facility] Provider Note   CSN: [id number] Arrival date & time: 01/07/23  2041     History  Chief Complaint  Patient presents with   Chest Pain    Jonathan Marks is a 67 y.o. male.  Patient presents to the emergency department with multiple complaints.  Patient has been sick most of the day.  He has had cough, chest congestion, intermittent pains in his chest, cramping in the abdomen.  No nausea, vomiting, diarrhea.       Home Medications Prior to Admission medications   Medication Sig Start Date End Date Taking? Authorizing Provider  benzonatate (TESSALON) 200 MG capsule Take 1 capsule (200 mg total) by mouth 3 (three) times daily as needed for cough. 01/08/23  Yes Keefe Zawistowski, Canary Brim, MD  HYDROcodone bit-homatropine (HYCODAN) 5-1.5 MG/5ML syrup Take 5 mLs by mouth every 6 (six) hours as needed for cough. 01/08/23  Yes Akim Watkinson, Canary Brim, MD  nirmatrelvir & ritonavir (PAXLOVID, 300/100,) 20 x 150 MG & 10 x 100MG  TBPK Take 3 tablets by mouth 2 (two) times daily for 5 days. 01/08/23 01/13/23 Yes Gilda Crease, MD  amLODipine (NORVASC) 10 MG tablet TAKE 1 TABLET BY MOUTH EVERY DAY 12/27/22   [name], [name], MD  valsartan-hydrochlorothiazide (DIOVAN-HCT) 320-25 MG tablet TAKE 1 TABLET BY MOUTH EVERY DAY 12/08/22   [name], [name], MD      Allergies    Patient has no known allergies.    Review of Systems   Review of Systems  Physical Exam Updated Vital Signs BP 121/77 (BP Location: Right Arm)   Pulse 81   Temp 98.8 F (37.1 C) (Oral)   Resp 18   SpO2 96%  Physical Exam Vitals and nursing note reviewed.  Constitutional:      General: He is not in acute distress.    Appearance: He is well-developed.  HENT:     Head: Normocephalic and atraumatic.     Mouth/Throat:     Mouth: Mucous membranes are moist.  Eyes:     General: Vision grossly intact. Gaze aligned appropriately.     Extraocular  Movements: Extraocular movements intact.     Conjunctiva/sclera: Conjunctivae normal.  Cardiovascular:     Rate and Rhythm: Normal rate and regular rhythm.     Pulses: Normal pulses.     Heart sounds: Normal heart sounds, S1 normal and S2 normal. No murmur heard.    No friction rub. No gallop.  Pulmonary:     Effort: Pulmonary effort is normal. No respiratory distress.     Breath sounds: Normal breath sounds.  Abdominal:     Palpations: Abdomen is soft.     Tenderness: There is no abdominal tenderness. There is no guarding or rebound.     Hernia: No hernia is present.  Musculoskeletal:        General: No swelling.     Cervical back: Full passive range of motion without pain, normal range of motion and neck supple. No pain with movement, spinous process tenderness or muscular tenderness. Normal range of motion.     Right lower leg: No edema.     Left lower leg: No edema.  Skin:    General: Skin is warm and dry.     Capillary Refill: Capillary refill takes less than 2 seconds.     Findings: No ecchymosis, erythema, lesion or wound.  Neurological:     Mental Status: He is alert and oriented to  person, place, and time.     GCS: GCS eye subscore is 4. GCS verbal subscore is 5. GCS motor subscore is 6.     Cranial Nerves: Cranial nerves 2-12 are intact.     Sensory: Sensation is intact.     Motor: Motor function is intact. No weakness or abnormal muscle tone.     Coordination: Coordination is intact.  Psychiatric:        Mood and Affect: Mood normal.        Speech: Speech normal.        Behavior: Behavior normal.     ED Results / Procedures / Treatments   Labs (all labs ordered are listed, but only abnormal results are displayed) Labs Reviewed  SARS CORONAVIRUS 2 BY RT PCR - Abnormal; Notable for the following components:      Result Value   SARS Coronavirus 2 by RT PCR POSITIVE (*)    All other components within normal limits  BASIC METABOLIC PANEL - Abnormal; Notable for the  following components:   Glucose, Bld 143 (*)    Creatinine, Ser 1.28 (*)    All other components within normal limits  CBC - Abnormal; Notable for the following components:   MCHC 36.7 (*)    All other components within normal limits  TROPONIN I (HIGH SENSITIVITY)  TROPONIN I (HIGH SENSITIVITY)    EKG EKG Interpretation Date/Time:  Sunday January 07 2023 21:01:12 EDT Ventricular Rate:  108 PR Interval:  162 QRS Duration:  102 QT Interval:  338 QTC Calculation: 452 R Axis:   84  Text Interpretation: Sinus tachycardia with Fusion complexes Nonspecific T wave abnormality Abnormal ECG When compared with ECG of 27-Jan-2021 11:37, No acute changes Confirmed by Gilda Crease [phone]) on 01/08/2023 1:49:18 AM  Radiology DG Chest 2 View  Result Date: 01/07/2023 CLINICAL DATA:  Chest pain EXAM: CHEST - 2 VIEW COMPARISON:  01/27/2021 FINDINGS: The heart size and mediastinal contours are within normal limits. Both lungs are clear. The visualized skeletal structures are unremarkable. IMPRESSION: No active cardiopulmonary disease. Electronically Signed   By: Charlett Nose M.D.   On: 01/07/2023 21:38    Procedures Procedures    Medications Ordered in ED Medications  acetaminophen (TYLENOL) tablet 650 mg (650 mg Oral Given 01/07/23 2114)    ED Course/ Medical Decision Making/ A&P                                 Medical Decision Making Amount and/or Complexity of Data Reviewed External Data Reviewed: ECG. Labs: ordered. Decision-making details documented in ED Course. Radiology: ordered and independent interpretation performed. Decision-making details documented in ED Course. ECG/medicine tests: ordered and independent interpretation performed. Decision-making details documented in ED Course.  Risk OTC drugs.   Differential Diagnosis considered includes, but not limited to: STEMI; NSTEMI; myocarditis; pericarditis; pulmonary embolism; aortic dissection; pneumothorax;  pneumonia; gastritis; musculoskeletal pain  Presents to the emergency department for evaluation of multiple complaints.  Patient with malaise, cough, chest congestion.  Patient experiencing central chest pain exacerbated by cough.  He was having abdominal pain and cramping diffusely earlier, this seems to have resolved.  Patient with fever at arrival.  Chest x-ray without obvious pneumonia.  Patient has tested positive for COVID which I do believe explains current symptoms.  No shortness of breath currently, vital signs have normalized now that he is no longer febrile.  No persistent tachycardia, no hypoxia.  Doubt PE.  No elevation of troponin, doubt myocarditis.  Will offer Paxlovid, symptomatic treatment.        Final Clinical Impression(s) / ED Diagnoses Final diagnoses:  COVID-19    Rx / DC Orders ED Discharge Orders          Ordered    nirmatrelvir & ritonavir (PAXLOVID, 300/100,) 20 x 150 MG & 10 x 100MG  TBPK  2 times daily        01/08/23 0157    benzonatate (TESSALON) 200 MG capsule  3 times daily PRN        01/08/23 0157    HYDROcodone bit-homatropine (HYCODAN) 5-1.5 MG/5ML syrup  Every 6 hours PRN        01/08/23 0157              Gilda Crease, MD 01/08/23 0157

## 2023-01-17 ENCOUNTER — Other Ambulatory Visit: Payer: Self-pay | Admitting: Internal Medicine

## 2023-01-17 DIAGNOSIS — I10 Essential (primary) hypertension: Secondary | ICD-10-CM

## 2023-01-24 ENCOUNTER — Other Ambulatory Visit: Payer: Self-pay | Admitting: Internal Medicine

## 2023-01-24 DIAGNOSIS — I10 Essential (primary) hypertension: Secondary | ICD-10-CM

## 2023-02-02 ENCOUNTER — Other Ambulatory Visit: Payer: Self-pay | Admitting: Internal Medicine

## 2023-02-02 DIAGNOSIS — I10 Essential (primary) hypertension: Secondary | ICD-10-CM

## 2023-02-28 ENCOUNTER — Other Ambulatory Visit: Payer: Self-pay | Admitting: Internal Medicine

## 2023-02-28 DIAGNOSIS — I10 Essential (primary) hypertension: Secondary | ICD-10-CM

## 2023-08-09 ENCOUNTER — Ambulatory Visit: Payer: Medicare HMO | Admitting: Internal Medicine

## 2023-08-09 ENCOUNTER — Encounter: Payer: Self-pay | Admitting: Internal Medicine

## 2023-08-14 ENCOUNTER — Other Ambulatory Visit (HOSPITAL_BASED_OUTPATIENT_CLINIC_OR_DEPARTMENT_OTHER): Payer: Self-pay

## 2023-08-14 DIAGNOSIS — R0609 Other forms of dyspnea: Secondary | ICD-10-CM

## 2023-08-16 ENCOUNTER — Ambulatory Visit (HOSPITAL_BASED_OUTPATIENT_CLINIC_OR_DEPARTMENT_OTHER): Payer: Medicare (Managed Care)

## 2023-08-16 DIAGNOSIS — R0609 Other forms of dyspnea: Secondary | ICD-10-CM

## 2023-08-16 LAB — PULMONARY FUNCTION TEST
DL/VA % pred: 88 %
DL/VA: 3.64 ml/min/mmHg/L
DLCO cor % pred: 93 %
DLCO cor: 24.28 ml/min/mmHg
DLCO unc % pred: 93 %
DLCO unc: 24.28 ml/min/mmHg
FEF 25-75 Post: 3.86 L/s
FEF 25-75 Pre: 2.86 L/s
FEF2575-%Change-Post: 35 %
FEF2575-%Pred-Post: 148 %
FEF2575-%Pred-Pre: 109 %
FEV1-%Change-Post: 7 %
FEV1-%Pred-Post: 103 %
FEV1-%Pred-Pre: 96 %
FEV1-Post: 3.43 L
FEV1-Pre: 3.2 L
FEV1FVC-%Change-Post: 0 %
FEV1FVC-%Pred-Pre: 104 %
FEV6-%Change-Post: 7 %
FEV6-%Pred-Post: 104 %
FEV6-%Pred-Pre: 97 %
FEV6-Post: 4.42 L
FEV6-Pre: 4.1 L
FEV6FVC-%Change-Post: 0 %
FEV6FVC-%Pred-Post: 104 %
FEV6FVC-%Pred-Pre: 104 %
FVC-%Change-Post: 7 %
FVC-%Pred-Post: 100 %
FVC-%Pred-Pre: 92 %
FVC-Post: 4.45 L
FVC-Pre: 4.13 L
Post FEV1/FVC ratio: 77 %
Post FEV6/FVC ratio: 99 %
Pre FEV1/FVC ratio: 77 %
Pre FEV6/FVC Ratio: 99 %
RV % pred: 225 %
RV: 5.25 L
TLC % pred: 130 %
TLC: 8.94 L

## 2023-08-16 NOTE — Progress Notes (Signed)
 Full PFT Performed Today

## 2023-08-16 NOTE — Patient Instructions (Signed)
 Full PFT Performed Today

## 2023-11-06 ENCOUNTER — Other Ambulatory Visit: Payer: Self-pay | Admitting: Family Medicine

## 2023-11-06 ENCOUNTER — Ambulatory Visit
Admission: RE | Admit: 2023-11-06 | Discharge: 2023-11-06 | Disposition: A | Discharge status: Home / Self Care | Payer: Medicare (Managed Care) | Source: Ambulatory Visit | Attending: Family Medicine | Admitting: Family Medicine

## 2023-11-06 DIAGNOSIS — R059 Cough, unspecified: Secondary | ICD-10-CM
